# Patient Record
Sex: Male | Born: 1935 | Race: White | Hispanic: No | Marital: Married | Smoking: Former smoker
Health system: Southern US, Community
[De-identification: ages and names within clinical notes are randomized; demographics above are authoritative.]

## PROBLEM LIST (undated history)

## (undated) DIAGNOSIS — I739 Peripheral vascular disease, unspecified: Secondary | ICD-10-CM

## (undated) DIAGNOSIS — I2510 Atherosclerotic heart disease of native coronary artery without angina pectoris: Secondary | ICD-10-CM

## (undated) DIAGNOSIS — E7800 Pure hypercholesterolemia, unspecified: Secondary | ICD-10-CM

## (undated) DIAGNOSIS — I714 Abdominal aortic aneurysm, without rupture, unspecified: Secondary | ICD-10-CM

## (undated) DIAGNOSIS — I219 Acute myocardial infarction, unspecified: Secondary | ICD-10-CM

## (undated) HISTORY — DX: Atherosclerotic heart disease of native coronary artery without angina pectoris: I25.10

## (undated) HISTORY — PX: SPINE SURGERY: SHX786

## (undated) HISTORY — DX: Acute myocardial infarction, unspecified: I21.9

## (undated) HISTORY — PX: APPENDECTOMY: SHX54

---

## 1989-06-07 HISTORY — PX: BACK SURGERY: SHX140

## 2010-10-07 DIAGNOSIS — I2510 Atherosclerotic heart disease of native coronary artery without angina pectoris: Secondary | ICD-10-CM

## 2010-10-07 HISTORY — DX: Atherosclerotic heart disease of native coronary artery without angina pectoris: I25.10

## 2011-08-08 HISTORY — PX: FRACTURE SURGERY: SHX138

## 2011-09-16 ENCOUNTER — Encounter (HOSPITAL_BASED_OUTPATIENT_CLINIC_OR_DEPARTMENT_OTHER): Payer: Medicare Other | Attending: Internal Medicine

## 2011-09-16 DIAGNOSIS — E119 Type 2 diabetes mellitus without complications: Secondary | ICD-10-CM | POA: Insufficient documentation

## 2011-09-16 DIAGNOSIS — L89509 Pressure ulcer of unspecified ankle, unspecified stage: Secondary | ICD-10-CM | POA: Insufficient documentation

## 2011-09-16 DIAGNOSIS — L8990 Pressure ulcer of unspecified site, unspecified stage: Secondary | ICD-10-CM | POA: Insufficient documentation

## 2011-09-16 DIAGNOSIS — Z79899 Other long term (current) drug therapy: Secondary | ICD-10-CM | POA: Insufficient documentation

## 2011-09-16 DIAGNOSIS — E785 Hyperlipidemia, unspecified: Secondary | ICD-10-CM | POA: Insufficient documentation

## 2011-09-16 DIAGNOSIS — Z7982 Long term (current) use of aspirin: Secondary | ICD-10-CM | POA: Insufficient documentation

## 2011-09-16 DIAGNOSIS — Z794 Long term (current) use of insulin: Secondary | ICD-10-CM | POA: Insufficient documentation

## 2011-09-16 DIAGNOSIS — L89899 Pressure ulcer of other site, unspecified stage: Secondary | ICD-10-CM | POA: Insufficient documentation

## 2011-09-16 NOTE — Progress Notes (Unsigned)
[facility]  NAME:  [name], [name]                ACCOUNT NO.:  [id number]  MEDICAL RECORD NO.:  [id number]      DATE OF BIRTH:  1935/10/02  PHYSICIAN:  Jonelle Sports. Saxon Crosby, M.D.  VISIT DATE:  09/16/2011                                  OFFICE VISIT   HISTORY OF PRESENT ILLNESS:  This 76 year old gentleman is seen referral of Dr. Chryl Heck for assistance with management of some apparent pressure wounds in association with recent right foot and ankle trauma and surgery.  The patient has a background history of type 2 diabetes, which apparently has never been an extremely tight control which has not been a source of particular problems to him and most specifically, he has had no foot ulcers on that basis.  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  With that background history apparently on August 22, 2011, the patient sustained a fall in his bedroom where he apparently everted his left foot and sustained a fracture at the right ankle.  Four days later, he underwent open reduction and internal fixation after __________ was made for swelling to go down etc.  He apparently has done well from a surgical perspective with x-rays continued to show good alignment, et [name].  However, he has been in a walking boot for that extremity to allow him to get back and forth to the bathroom, and apparently has sustained some pressure injuries secondary to that.  It is for those that he is referred here today for our evaluation and advice.  PAST MEDICAL HISTORY:  Surgeries include previous lumbar disk surgery, the open reduction and internal fixation of the fracture of the left malleolus on August 26, 2011.  OTHER HOSPITALIZATIONS:  None.  ALLERGIES:  He has no known medicinal allergies.  REGULAR MEDICATIONS: 1. Cephalexin 500 mg q.i.d. 2. OxyContin 10 mg twice daily. 3. Metformin  1000 mg twice daily. 4. NovoLog insulin 70/30, 25 units before each meal that he takes. 5. Lantus 30 units at bedtime. 6. He is also on simvastatin 40 mg daily. 7. Lovaza 1 g twice daily. 8. Baby aspirin 81 mg daily.  PERSONAL HISTORY:  He is married and lives with his wife.  He presently can do very little without assistance because of this injury.  He denies smoking, use of alcohol or street drugs.  He is retired and does not work outside the home at this point.  FAMILY HISTORY:  Not reviewed in detail but is not particularly pertinent to his given situation though he has diabetes in the family.  REVIEW OF SYSTEMS:  The patient denies any previous stroke or cerebrovascular symptomatology, has no history of heart disease or hypertension.  He does have hyperlipidemia which is being treated, and does have type 2 diabetes, which has been treated with limited success. He does not know his hemoglobin A1c level but says that blood sugars ordinarily run between 160-220 mg/dL and that they have been somewhat higher since this illness.  He has no history of cancer.  No radiation or chemotherapy.  Has never required dialysis nor is he aware of any renal insufficiency.  He has no history of blood dyscrasias, unexplained anemia or obvious  blood loss.  PHYSICAL EXAMINATION:  VITAL SIGNS:  Blood pressure is 124/72, pulse is 110 and regular, respirations 18, temperature 98.5, blood glucose 160 mg/dL. GENERAL:  He is alert, cooperative, elderly gentleman was appearing his stated age of 31, in some discomfort but no striking distress. SKIN:  His skin texture and turgor are good.  His color is satisfactory. NECKS:  His neck veins are flat. CHEST:  Grossly clear to auscultation. HEART:  Rapid regular rhythm with no definite murmur or gallop. ABDOMEN:  Slightly protuberant but nontender without obvious organomegaly or masses. EXTREMITIES:  All pulses palpable.  No significant edema.  The right  lower extremity is removed from his walking cast, and he seems to have approximately 7 cm of surgical incision over the lateral malleolus where there is some slight dehiscence and evidence of some degree of tissue injury and maceration surrounding the wound which appears related frankly to pressure.  On the medial aspect of that same ankle and hindfoot is an area of first to second-degree skin injury with some redness and weeping as well. Over the medial aspect of the ankle on several areas on the distal anterior shin over the heel laterally and at the fifth metatarsal head area, both on the plantar aspect and laterally, there are evidence for pressure wounds with tissue necrosis, which is dry and intact at this point.  IMPRESSION:  Multiple pressure wounds of the right lower extremity likely secondary to the walking boot he has been wearing.  He also has evidence of a tendency to externally rotate that leg which would explain why even in bed he may well have had some additional specific pressure to the lateral aspect of the heel and the fifth metatarsal head areas as well as that of the wound.  Diabetes mellitus type 2, currently in suboptimal control.  DISPOSITION:  With respect to the diabetes, we have encouraged him to tightness control if possible.  I have suggested he increase his Lantus to 40 units each night from its current dose of 30, and that he consult with his primary physician regarding possible other adjustments and probably ideally some more frequent surveillance.  With respect to the wounds, there is nothing I see now that would be benefited by debridement and indeed most of these areas would be ill served to remove the early developing eschars in that they are now adequately covered to prevent invasion of infection.  The wound itself looks as if it will not primarily heal.  Stitches are still in place, and again I do not think any effective debridement can be  done at that site right now.  We have elected to cover the wound itself with Silvercel, which contains silvers and anti-infective agent and alginate to absorb the drainage there.  We have covered the other areas simply with foam dressing of various sorts.  We placed that foot in a bulky protective wrap and returned in to his walking cast closing it very loosely.  We recommended to the patient that he temporarily go on bedrest and that he supports this  right lower extremity at the calf with pillows and/or a foam padding, so that pressure from all areas related to the wound is avoided.  We have suggested that he use an urinal and a bedside commode, so that he can simply put it out of bed on his good leg and not have to do any weightbearing and not have to replace the cast.  We recommended that he continue on  his current antibiotic, and his wound was not re-cultured today.  We do feel that although his vascular status appears adequate and although his ABI here is 1.0 that in the face of diabetes sometimes these can be misleading and we would prefer that he have arterial Dopplers done.  This apparently can be done in [place]. He is given a prescription to present to the hospital, vascular lab to have those studies done.  I have spoken with one of the nurses in the referring doctor's office in the absence of the surgeon and her PA both today and have told them of our plans.  I do believe that there might ought to be some consideration of blood thinner other than his baby aspirin, and I will leave this to the managing physicians in [place].  Plan will be for the patient to return here in 1 week, and we will re- evaluate these wounds.  It is noted that in the interim he is to see the surgeon for consideration of removal of the sutures which I suspect might just as well be done from our point of view as well as from theirs.  Again, his followup visit here will be in 1 week or sooner  if need be.          ______________________________ Jonelle Sports. Cheryll Cockayne, M.D.     RES/MEDQ  D:  09/16/2011  T:  09/16/2011  Job:  [id number]  cc:   Carie Caddy

## 2011-09-23 ENCOUNTER — Inpatient Hospital Stay (HOSPITAL_COMMUNITY): Payer: Medicare Other

## 2011-09-23 ENCOUNTER — Encounter: Payer: Self-pay | Admitting: Emergency Medicine

## 2011-09-23 ENCOUNTER — Emergency Department (HOSPITAL_COMMUNITY)
Admission: EM | Admit: 2011-09-23 | Discharge: 2011-09-23 | Disposition: A | Discharge status: Home / Self Care | Payer: Medicare Other | Source: Home / Self Care | Attending: Emergency Medicine | Admitting: Emergency Medicine

## 2011-09-23 ENCOUNTER — Emergency Department (HOSPITAL_COMMUNITY): Payer: Medicare Other

## 2011-09-23 ENCOUNTER — Encounter: Payer: Self-pay | Admitting: Surgery

## 2011-09-23 ENCOUNTER — Inpatient Hospital Stay (HOSPITAL_COMMUNITY)
Admission: AD | Admit: 2011-09-23 | Discharge: 2011-10-03 | DRG: 253 | Disposition: A | Discharge status: Home Health | Payer: Medicare Other | Source: Ambulatory Visit | Attending: Surgery | Admitting: Surgery

## 2011-09-23 ENCOUNTER — Ambulatory Visit (INDEPENDENT_AMBULATORY_CARE_PROVIDER_SITE_OTHER): Payer: Medicare Other | Admitting: Surgery

## 2011-09-23 VITALS — BP 142/80 | HR 105 | Temp 97.6°F | Resp 18 | Ht 70.0 in | Wt 214.0 lb

## 2011-09-23 DIAGNOSIS — I70269 Atherosclerosis of native arteries of extremities with gangrene, unspecified extremity: Principal | ICD-10-CM | POA: Diagnosis present

## 2011-09-23 DIAGNOSIS — L97309 Non-pressure chronic ulcer of unspecified ankle with unspecified severity: Secondary | ICD-10-CM | POA: Diagnosis present

## 2011-09-23 DIAGNOSIS — Z7982 Long term (current) use of aspirin: Secondary | ICD-10-CM

## 2011-09-23 DIAGNOSIS — E1169 Type 2 diabetes mellitus with other specified complication: Secondary | ICD-10-CM | POA: Diagnosis present

## 2011-09-23 DIAGNOSIS — L98499 Non-pressure chronic ulcer of skin of other sites with unspecified severity: Secondary | ICD-10-CM

## 2011-09-23 DIAGNOSIS — Y831 Surgical operation with implant of artificial internal device as the cause of abnormal reaction of the patient, or of later complication, without mention of misadventure at the time of the procedure: Secondary | ICD-10-CM | POA: Diagnosis present

## 2011-09-23 DIAGNOSIS — Z01818 Encounter for other preprocedural examination: Secondary | ICD-10-CM

## 2011-09-23 DIAGNOSIS — I498 Other specified cardiac arrhythmias: Secondary | ICD-10-CM | POA: Diagnosis not present

## 2011-09-23 DIAGNOSIS — E785 Hyperlipidemia, unspecified: Secondary | ICD-10-CM | POA: Diagnosis present

## 2011-09-23 DIAGNOSIS — Z79899 Other long term (current) drug therapy: Secondary | ICD-10-CM

## 2011-09-23 DIAGNOSIS — E1159 Type 2 diabetes mellitus with other circulatory complications: Secondary | ICD-10-CM

## 2011-09-23 DIAGNOSIS — T84498A Other mechanical complication of other internal orthopedic devices, implants and grafts, initial encounter: Secondary | ICD-10-CM | POA: Diagnosis present

## 2011-09-23 DIAGNOSIS — M9080 Osteopathy in diseases classified elsewhere, unspecified site: Secondary | ICD-10-CM | POA: Diagnosis present

## 2011-09-23 DIAGNOSIS — I7092 Chronic total occlusion of artery of the extremities: Secondary | ICD-10-CM

## 2011-09-23 DIAGNOSIS — F411 Generalized anxiety disorder: Secondary | ICD-10-CM | POA: Diagnosis not present

## 2011-09-23 DIAGNOSIS — M869 Osteomyelitis, unspecified: Secondary | ICD-10-CM | POA: Diagnosis present

## 2011-09-23 DIAGNOSIS — R339 Retention of urine, unspecified: Secondary | ICD-10-CM | POA: Diagnosis not present

## 2011-09-23 DIAGNOSIS — I739 Peripheral vascular disease, unspecified: Secondary | ICD-10-CM

## 2011-09-23 DIAGNOSIS — S91309A Unspecified open wound, unspecified foot, initial encounter: Secondary | ICD-10-CM

## 2011-09-23 DIAGNOSIS — Z794 Long term (current) use of insulin: Secondary | ICD-10-CM

## 2011-09-23 DIAGNOSIS — I10 Essential (primary) hypertension: Secondary | ICD-10-CM

## 2011-09-23 DIAGNOSIS — L97509 Non-pressure chronic ulcer of other part of unspecified foot with unspecified severity: Secondary | ICD-10-CM | POA: Diagnosis present

## 2011-09-23 HISTORY — DX: Pure hypercholesterolemia, unspecified: E78.00

## 2011-09-23 LAB — BASIC METABOLIC PANEL
GFR calc Af Amer: 70 mL/min — ABNORMAL LOW (ref >90)
GFR calc non Af Amer: 60 mL/min — ABNORMAL LOW (ref >90)
Potassium: 4.2 mEq/L (ref 3.5–5.1)
Sodium: 134 mEq/L — ABNORMAL LOW (ref 135–145)

## 2011-09-23 LAB — CBC
Hemoglobin: 11.8 g/dL — ABNORMAL LOW (ref 13.0–17.0)
RBC: 4.11 MIL/uL — ABNORMAL LOW (ref 4.22–5.81)

## 2011-09-23 MED ORDER — METOPROLOL TARTRATE 1 MG/ML IV SOLN: 2.0000 mg | INTRAVENOUS | Status: DC | PRN

## 2011-09-23 MED ORDER — OXYCODONE HCL 5 MG PO TABS
5.0000 mg | ORAL_TABLET | ORAL | Status: DC | PRN
  Administered 2011-09-23 – 2011-09-24 (×2): 10 mg via ORAL
  Administered 2011-09-25 (×2): 5 mg via ORAL
  Administered 2011-09-26: 10 mg via ORAL
  Filled 2011-09-23 (×2): qty 2
  Filled 2011-09-23: qty 1
  Filled 2011-09-23: qty 2
  Filled 2011-09-23: qty 1

## 2011-09-23 MED ORDER — ALUM & MAG HYDROXIDE-SIMETH 200-200-20 MG/5ML PO SUSP: 15.0000 mL | ORAL | Status: DC | PRN

## 2011-09-23 MED ORDER — LABETALOL HCL 5 MG/ML IV SOLN
10.0000 mg | INTRAVENOUS | Status: DC | PRN
  Filled 2011-09-23: qty 4

## 2011-09-23 MED ORDER — SODIUM CHLORIDE 0.9 % IV SOLN
INTRAVENOUS | Status: DC
  Administered 2011-09-23: 50 mL/h via INTRAVENOUS

## 2011-09-23 MED ORDER — VANCOMYCIN HCL IN DEXTROSE 1-5 GM/200ML-% IV SOLN
1000.0000 mg | Freq: Two times a day (BID) | INTRAVENOUS | Status: DC
  Administered 2011-09-24 – 2011-09-26 (×6): 1000 mg via INTRAVENOUS
  Filled 2011-09-23 (×8): qty 200

## 2011-09-23 MED ORDER — PHENOL 1.4 % MT LIQD
1.0000 | OROMUCOSAL | Status: DC | PRN
  Filled 2011-09-23: qty 177

## 2011-09-23 MED ORDER — OXYCODONE-ACETAMINOPHEN 5-325 MG PO TABS
2.0000 | ORAL_TABLET | Freq: Once | ORAL | Status: AC
  Administered 2011-09-23: 2 via ORAL
  Filled 2011-09-23: qty 2

## 2011-09-23 MED ORDER — MORPHINE SULFATE 2 MG/ML IJ SOLN
2.0000 mg | INTRAMUSCULAR | Status: DC | PRN
  Administered 2011-09-23: 2 mg via INTRAVENOUS
  Administered 2011-09-23 – 2011-09-25 (×9): 4 mg via INTRAVENOUS
  Administered 2011-09-27: 2 mg via INTRAVENOUS
  Filled 2011-09-23 (×3): qty 2
  Filled 2011-09-23: qty 1
  Filled 2011-09-23 (×4): qty 2
  Filled 2011-09-23: qty 1
  Filled 2011-09-23 (×2): qty 2

## 2011-09-23 MED ORDER — HYDRALAZINE HCL 20 MG/ML IJ SOLN
10.0000 mg | INTRAMUSCULAR | Status: DC | PRN
  Filled 2011-09-23: qty 0.5

## 2011-09-23 MED ORDER — FAMOTIDINE IN NACL 20-0.9 MG/50ML-% IV SOLN
20.0000 mg | Freq: Two times a day (BID) | INTRAVENOUS | Status: DC
  Administered 2011-09-23 – 2011-09-25 (×4): 20 mg via INTRAVENOUS
  Filled 2011-09-23 (×5): qty 50

## 2011-09-23 MED ORDER — POTASSIUM CHLORIDE CRYS ER 20 MEQ PO TBCR
20.0000 meq | EXTENDED_RELEASE_TABLET | Freq: Once | ORAL | Status: AC
  Administered 2011-09-23: 20 meq via ORAL

## 2011-09-23 MED ORDER — VANCOMYCIN HCL 1000 MG IV SOLR
1500.0000 mg | Freq: Once | INTRAVENOUS | Status: AC
  Administered 2011-09-23: 1500 mg via INTRAVENOUS
  Filled 2011-09-23 (×2): qty 1500

## 2011-09-23 MED ORDER — ONDANSETRON HCL 4 MG/2ML IJ SOLN
4.0000 mg | Freq: Four times a day (QID) | INTRAMUSCULAR | Status: DC | PRN
  Filled 2011-09-23: qty 2

## 2011-09-23 MED ORDER — MORPHINE SULFATE 2 MG/ML IJ SOLN
INTRAMUSCULAR | Status: AC
  Administered 2011-09-23: 4 mg via INTRAVENOUS
  Filled 2011-09-23: qty 2

## 2011-09-23 MED ORDER — MORPHINE SULFATE 4 MG/ML IJ SOLN: 6.0000 mg | Freq: Once | INTRAMUSCULAR | Status: DC

## 2011-09-23 MED ORDER — GUAIFENESIN-DM 100-10 MG/5ML PO SYRP: 15.0000 mL | ORAL_SOLUTION | ORAL | Status: DC | PRN

## 2011-09-23 MED ORDER — ENOXAPARIN SODIUM 40 MG/0.4ML ~~LOC~~ SOLN
40.0000 mg | SUBCUTANEOUS | Status: DC
  Administered 2011-09-23 – 2011-09-24 (×2): 40 mg via SUBCUTANEOUS
  Filled 2011-09-23 (×2): qty 0.4

## 2011-09-23 MED ORDER — PIPERACILLIN-TAZOBACTAM 3.375 G IVPB
3.3750 g | Freq: Three times a day (TID) | INTRAVENOUS | Status: DC
  Administered 2011-09-23 – 2011-09-27 (×11): 3.375 g via INTRAVENOUS
  Filled 2011-09-23 (×16): qty 50

## 2011-09-23 NOTE — ED Notes (Signed)
Pt has had surgery on foot and the stiches have been in for 6 weeks, pt states foot is now infected pmd doesn't want to take them out due to infection. Has apt with wound center at 1500 today, pt is having pain.

## 2011-09-23 NOTE — ED Notes (Signed)
States that he has an appt at the wound care center in [place] at 1500 but was unable to tolerate the pain.

## 2011-09-23 NOTE — Progress Notes (Signed)
[facility]   Patient name: [name] MRN: [id number] DOB: [birth year] Sex: male   Referred by: Dr. Cheryll Cockayne  Reason for referral:  Chief Complaint  Patient presents with  . New Evaluation    Open wound over Right malleolus   REF-->> Dr. Cheryll Cockayne.   Pt had ORIF on Right ankle on 08-26-2011  at [facility].    HISTORY OF PRESENT ILLNESS: The patient comes in today for a wound evaluation of his right leg. Approximately one month ago he broke his right ankle during a fall he underwent open reduction and internal fixation in [place]. He now has an eschar on the medial and some of his foot as well as exposed hardware. 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 He is having significant pain in his leg. She's been told by the physicians and aspirin that he needs a below knee amputation the wound center felt that this could be salvaged. For that reason the patient was sent to me for further evaluation the patient is highly functional at home prior to this fracture.  The patient is a diabetic his blood sugars range between 1:30 and 150. He told me that his hemoglobin A1c was very high at his most recent visit and his insulin was increased. He also takes a statin for his hypercholesterolemia. He is a nonsmoker.  Past Medical History  Diagnosis Date  . Diabetes mellitus   . Hypercholesteremia     Past Surgical History  Procedure Date  . Spine surgery   . Fracture surgery 08-2011    ORIF right ankle    History   Social History  . Marital Status: Married    Spouse Name: N/A    Number of Children: N/A  . Years of Education: N/A   Occupational History  . Not on file.   Social History Main Topics  . Smoking status: Never Smoker   . Smokeless tobacco: Not on file  . Alcohol Use: No  . Drug Use: No  . Sexually Active:    Other Topics Concern  . Not on file    Social History Narrative  . No narrative on file    Family History  Problem Relation Age of Onset  . Diabetes Other     Allergies as of 09/23/2011  . (No Known Allergies)    Current Outpatient Prescriptions on File Prior to Visit  Medication Sig Dispense Refill  . aspirin EC 81 MG tablet Take 81 mg by mouth every evening.        . cephALEXin (KEFLEX) 500 MG capsule Take 500 mg by mouth 4 (four) times daily.        [name] ibuprofen (ADVIL,MOTRIN) 200 MG tablet Take 400 mg by mouth every 8 (eight) hours as needed. For pain.       [name] insulin aspart protamine-insulin aspart (NOVOLOG 70/30) (70-30) 100 UNIT/ML injection Inject 25 Units into the skin 3 (three) times daily with meals.        . insulin glargine (LANTUS) 100 UNIT/ML injection Inject 30 Units into the skin at bedtime.        . metFORMIN (GLUCOPHAGE) 1000 MG tablet Take 1,000 mg by mouth 2 (two) times daily.        [name] omega-3 acid ethyl esters (LOVAZA) 1 G capsule Take 1 g by mouth 2 (two) times daily.        [name] oxyCODONE (OXYCONTIN)  10 MG 12 hr tablet Take 10 mg by mouth every 12 (twelve) hours.        . simvastatin (ZOCOR) 40 MG tablet Take 40 mg by mouth at bedtime.         Current Facility-Administered Medications on File Prior to Visit  Medication Dose Route Frequency Provider Last Rate Last Dose  . morphine 2 MG/ML injection        4 mg at 09/23/11 0820  . oxyCODONE-acetaminophen (PERCOCET) 5-325 MG per tablet 2 tablet  2 tablet Oral Once Nat Christen, MD   2 tablet at 09/23/11 0944  . DISCONTD: morphine 4 MG/ML injection 6 mg  6 mg Intravenous Once Nat Christen, MD         REVIEW OF SYSTEMS: Cardiovascular: No chest pain, chest pressure, palpitations, orthopnea, or dyspnea on exertion. No claudication or rest pain,  No history of DVT or phlebitis. Pulmonary: No productive cough, asthma or wheezing. Neurologic: No weakness, paresthesias, aphasia, or amaurosis. No dizziness. Hematologic: No bleeding problems or  clotting disorders. Musculoskeletal: No joint pain or joint swelling. Gastrointestinal: No blood in stool or hematemesis Genitourinary: No dysuria or hematuria. Psychiatric:: No history of major depression. Integumentary: Eschar an open wound on right leg Constitutional: No fever or chills.  PHYSICAL EXAMINATION: General: The patient appears their stated age.  Vital signs are BP 142/80  Pulse 105  Temp(Src) 97.6 F (36.4 C) (Oral)  Resp 18  Ht 5\' 10"  (1.778 m)  Wt 214 lb (97.07 kg)  BMI 30.71 kg/m2  SpO2 93% HEENT:  No gross abnormalities Pulmonary: Respirations are non-labored Abdomen: Soft and non-tender  Musculoskeletal: Exposed hardware right ankle Neurologic: No focal weakness or paresthesias are detected, decreased sensation and motor function of the right foot Skin: Open wound right foot with exposed hardware Psychiatric: The patient has normal affect. Cardiovascular: There is a regular rate and rhythm without significant murmur appreciated. Palpable femoral pulses  Diagnostic Studies: Outside ultrasound shows occluded right superficial femoral artery ankle-brachial indices could not be obtained   Medication Changes: None  Assessment:  Right ankle fracture status post repair with exposed hardware Plan: I will plan on admitting the patient to the hospital for IV antibiotics. I'll consult orthopedics to get their recommendations with regards to removing of the hardware. I would also like them to comment if they feel that this leg is salvageable. In my opinion I feel like we should proceed with revascularization in an attempt at limb salvage. I have scheduled him for arteriogram this Wednesday. I'll obtain x-rays upon his admission of the right ankle.     Jorge Ny, M.D. [facility] [place] Office: [phone] Pager:  [phone]

## 2011-09-23 NOTE — ED Provider Notes (Signed)
History     CSN: [id number] Arrival date & time: 09/23/2011  7:34 AM   First MD Initiated Contact with Patient 09/23/11 0750      Chief Complaint  Patient presents with  . Foot Pain    (Consider location/radiation/quality/duration/timing/severity/associated sxs/prior treatment) HPI Comments: Patient here after having broken his ankle several weeks ago when having an open reduction and internal fixation procedure performed in [place].  He has had problems with wound healing and his surgeon in [place] has referred him to the wound care center here at [facility].  He has first appointment last week.  He is currently on Keflex and OxyContin for pain control.  Patient presents complaining of increased pain.  He is post have a repeat wound care appointment this afternoon at 3 PM as well.  He notes some subjective fevers but is afebrile at this time.  He does not know what surgeon he is supposed to continue to followup with here since he states his previous doctor is not comfortable caring for him with his wound healing issues at this time.  Patient is a 76 y.o. male presenting with lower extremity pain. The history is provided by the patient and the spouse. No language interpreter was used.  Foot Pain This is a chronic problem. The current episode started more than 1 week ago. The problem occurs constantly. The problem has been gradually worsening. Pertinent negatives include no chest pain, no abdominal pain, no headaches and no shortness of breath.    Past Medical History  Diagnosis Date  . Diabetes mellitus   . Hypercholesteremia     Past Surgical History  Procedure Date  . Back surgery   . Ankle fracture surgery     History reviewed. No pertinent family history.  History  Substance Use Topics  . Smoking status: Never Smoker   . Smokeless tobacco: Not on file  . Alcohol Use:       Review of Systems  Constitutional: Negative.  Negative for fever and chills.  HENT: Negative.    Eyes: Negative.  Negative for discharge and redness.  Respiratory: Negative.  Negative for cough and shortness of breath.   Cardiovascular: Negative.  Negative for chest pain.  Gastrointestinal: Negative.  Negative for nausea, vomiting and abdominal pain.  Genitourinary: Negative.  Negative for hematuria.  Musculoskeletal: Positive for joint swelling and arthralgias. Negative for back pain.  Skin: Positive for color change and wound. Negative for rash.  Neurological: Negative for syncope and headaches.  Hematological: Negative.  Negative for adenopathy.  Psychiatric/Behavioral: Negative.  Negative for confusion.  All other systems reviewed and are negative.    Allergies  Review of patient's allergies indicates no known allergies.  Home Medications  No current outpatient prescriptions on file.  BP 126/68  Pulse 106  Temp(Src) 98.7 F (37.1 C) (Oral)  Resp 16  SpO2 95%  Physical Exam  Constitutional: He is oriented to person, place, and time. He appears well-developed and well-nourished.  Non-toxic appearance. He does not have a sickly appearance.  HENT:  Head: Normocephalic and atraumatic.  Right Ear: External ear normal.  Eyes: Conjunctivae, EOM and lids are normal. Pupils are equal, round, and reactive to light.  Neck: Trachea normal, normal range of motion and full passive range of motion without pain. Neck supple.  Cardiovascular: Normal rate, regular rhythm and normal heart sounds.   Pulmonary/Chest: Effort normal and breath sounds normal. No respiratory distress.  Abdominal: Soft. Normal appearance. He exhibits no distension. There is no  tenderness. There is no rebound and no CVA tenderness.  Musculoskeletal: Normal range of motion. He exhibits edema.  Neurological: He is alert and oriented to person, place, and time. He has normal strength.  Skin: Skin is warm, dry and intact. No rash noted. There is erythema.       Patient has mild swelling to his right foot.  It is  difficult to palpate pulses due to the swelling.  Patient at the lateral aspect of his ankle has an open and you can see the hardware from his surgery.  There is no drainage from the site. At the medial and lateral aspects of his ankle and dorsum of his foot have black areas.  There is mild erythema around the dorsum of his foot to his ankle.  Only mild warmth is present. There is serosanguinous drainage but no purulent drainage.  Sensation is decreased on the medial aspect of the foot.    Psychiatric: He has a normal mood and affect. His behavior is normal. Judgment and thought content normal.    ED Course  Procedures (including critical care time)     MDM  Patient discussed with the wound care nurse the patient saw last week and is going to see again today.  They have been obtaining workup for arterial studies and also believe that some of his wounds are actually pressure sores due to being bedridden since his surgery.  Patient does not have acute fevers here and does not have signs of significant infection requiring admission at this time.  He appears to have mild cellulitis but likely the erythema seen is part of the wound healing process.  Patient will have his pain treated here and the nurses stated that she will be able to work him in for a morning appointment to be seen at the wound care clinic where they can determine his further course of care.  As they do not feel this patient warrants inpatient admission for either pain control or IV antibiotics at this time once pain control is improved I feel the patient be safely discharged to go directly to the wound care clinic.        Nat Christen, MD 09/23/11 [phone]

## 2011-09-23 NOTE — Progress Notes (Addendum)
ANTIBIOTIC CONSULT NOTE - INITIAL  Pharmacy Consult for zosyn Indication: RLE wound  No Known Allergies  Patient Measurements: Height: 5\' 10"  (177.8 cm) Weight: 198 lb 13.7 oz (90.2 kg) IBW/kg (Calculated) : 73    Vital Signs: Temp: 97.7 F (36.5 C) (12/17 1525) Temp src: Oral (12/17 1525) BP: 151/81 mmHg (12/17 1525) Pulse Rate: 105  (12/17 1525) Intake/Output from previous day:   Intake/Output from this shift:    Labs:   Microbiology: No results found for this or any previous visit (from the past 720 hour(s)).  Medical History: Past Medical History  Diagnosis Date  . Diabetes mellitus   . Hypercholesteremia     Medications:  Prescriptions prior to admission  Medication Sig Dispense Refill  . aspirin EC 81 MG tablet Take 81 mg by mouth every evening.        [name] ibuprofen (ADVIL,MOTRIN) 200 MG tablet Take 400 mg by mouth every 8 (eight) hours as needed. For pain.       [name] insulin aspart protamine-insulin aspart (NOVOLOG 70/30) (70-30) 100 UNIT/ML injection Inject 25 Units into the skin 3 (three) times daily with meals.        . insulin glargine (LANTUS) 100 UNIT/ML injection Inject 30 Units into the skin at bedtime.        . metFORMIN (GLUCOPHAGE) 1000 MG tablet Take 1,000 mg by mouth 2 (two) times daily.        [name] omega-3 acid ethyl esters (LOVAZA) 1 G capsule Take 1 g by mouth 2 (two) times daily.        [name] oxyCODONE (OXYCONTIN) 10 MG 12 hr tablet Take 10 mg by mouth every 12 (twelve) hours.        . simvastatin (ZOCOR) 40 MG tablet Take 40 mg by mouth at bedtime.        . cephALEXin (KEFLEX) 500 MG capsule Take 500 mg by mouth 4 (four) times daily.         Assessment: 64 YOM s/p ORIF of R ankle approx 1 month ago following fall/fracture.  Ultrasound revealed occluded femoral artery.  Hardware is exposed, he was referred to [facility] for possible amputation, but also being evaluated for revascularization.  No labs are yet available   Plan:  1. Zosyn dose is 3.375gm IV  q8h with 4hr infusion.  OK for renal function 2. Also orders to start vancomcyin 1500mg  IV x1, then 1gm IV q12h, check steady state trough if vancomycin continues.    Dannielle Huh 09/23/2011,4:01 PM

## 2011-09-24 ENCOUNTER — Other Ambulatory Visit: Payer: Self-pay

## 2011-09-24 ENCOUNTER — Ambulatory Visit (HOSPITAL_COMMUNITY): Admit: 2011-09-24 | Payer: Self-pay | Admitting: Surgery

## 2011-09-24 ENCOUNTER — Encounter (HOSPITAL_COMMUNITY)
Admission: AD | Disposition: A | Discharge status: Home Health | Payer: Self-pay | Source: Ambulatory Visit | Attending: Surgery

## 2011-09-24 DIAGNOSIS — I495 Sick sinus syndrome: Secondary | ICD-10-CM

## 2011-09-24 DIAGNOSIS — Z01810 Encounter for preprocedural cardiovascular examination: Secondary | ICD-10-CM

## 2011-09-24 DIAGNOSIS — I739 Peripheral vascular disease, unspecified: Secondary | ICD-10-CM

## 2011-09-24 HISTORY — PX: LOWER EXTREMITY ANGIOGRAM: SHX5508

## 2011-09-24 LAB — CBC
HCT: 35.4 % — ABNORMAL LOW (ref 39.0–52.0)
Hemoglobin: 11.6 g/dL — ABNORMAL LOW (ref 13.0–17.0)
MCH: 28.4 pg (ref 26.0–34.0)
MCHC: 32.8 g/dL (ref 30.0–36.0)

## 2011-09-24 LAB — GLUCOSE, CAPILLARY
Glucose-Capillary: 189 mg/dL — ABNORMAL HIGH (ref 70–99)
Glucose-Capillary: 187 mg/dL — ABNORMAL HIGH (ref 70–99)

## 2011-09-24 SURGERY — ANGIOGRAM, LOWER EXTREMITY
Anesthesia: LOCAL

## 2011-09-24 MED ORDER — ENOXAPARIN SODIUM 40 MG/0.4ML ~~LOC~~ SOLN
40.0000 mg | SUBCUTANEOUS | Status: DC
  Administered 2011-09-24 – 2011-09-25 (×2): 40 mg via SUBCUTANEOUS
  Filled 2011-09-24 (×4): qty 0.4

## 2011-09-24 MED ORDER — MORPHINE SULFATE 4 MG/ML IJ SOLN: 4.0000 mg | Freq: Once | INTRAMUSCULAR | Status: AC

## 2011-09-24 MED ORDER — MIDAZOLAM HCL 2 MG/2ML IJ SOLN
INTRAMUSCULAR | Status: AC
  Filled 2011-09-24: qty 2

## 2011-09-24 MED ORDER — INSULIN GLARGINE 100 UNIT/ML ~~LOC~~ SOLN
30.0000 [IU] | Freq: Every day | SUBCUTANEOUS | Status: DC
  Administered 2011-09-24 – 2011-09-26 (×3): 30 [IU] via SUBCUTANEOUS
  Filled 2011-09-24 (×2): qty 3

## 2011-09-24 MED ORDER — INSULIN ASPART PROT & ASPART (70-30 MIX) 100 UNIT/ML ~~LOC~~ SUSP
25.0000 [IU] | Freq: Three times a day (TID) | SUBCUTANEOUS | Status: DC
  Administered 2011-09-24 – 2011-09-26 (×6): 25 [IU] via SUBCUTANEOUS
  Filled 2011-09-24: qty 3

## 2011-09-24 MED ORDER — HYDRALAZINE HCL 20 MG/ML IJ SOLN
INTRAMUSCULAR | Status: AC
  Filled 2011-09-24: qty 1

## 2011-09-24 MED ORDER — ALPRAZOLAM 0.25 MG PO TABS
0.2500 mg | ORAL_TABLET | Freq: Three times a day (TID) | ORAL | Status: DC | PRN
  Administered 2011-09-24 – 2011-09-26 (×3): 0.25 mg via ORAL
  Filled 2011-09-24 (×3): qty 1

## 2011-09-24 MED ORDER — OXYCODONE HCL 10 MG PO TB12
10.0000 mg | ORAL_TABLET | Freq: Two times a day (BID) | ORAL | Status: DC
  Administered 2011-09-24: 10 mg via ORAL
  Filled 2011-09-24: qty 1

## 2011-09-24 MED ORDER — OXYCODONE-ACETAMINOPHEN 5-325 MG PO TABS
1.0000 | ORAL_TABLET | ORAL | Status: DC | PRN
  Administered 2011-09-26 (×5): 2 via ORAL
  Filled 2011-09-24 (×5): qty 2

## 2011-09-24 MED ORDER — OMEGA-3-ACID ETHYL ESTERS 1 G PO CAPS
1.0000 g | ORAL_CAPSULE | Freq: Two times a day (BID) | ORAL | Status: DC
  Administered 2011-09-24 – 2011-09-27 (×7): 1 g via ORAL
  Filled 2011-09-24 (×8): qty 1

## 2011-09-24 MED ORDER — ACETAMINOPHEN 325 MG PO TABS: 650.0000 mg | ORAL_TABLET | ORAL | Status: DC | PRN

## 2011-09-24 MED ORDER — SIMVASTATIN 40 MG PO TABS: 40.0000 mg | ORAL_TABLET | Freq: Every day | ORAL | Status: DC

## 2011-09-24 MED ORDER — BISACODYL 10 MG RE SUPP: 10.0000 mg | Freq: Every day | RECTAL | Status: DC | PRN

## 2011-09-24 MED ORDER — INSULIN GLARGINE 100 UNIT/ML ~~LOC~~ SOLN: 30.0000 [IU] | Freq: Every day | SUBCUTANEOUS | Status: DC

## 2011-09-24 MED ORDER — INSULIN ASPART PROT & ASPART (70-30 MIX) 100 UNIT/ML ~~LOC~~ SUSP
25.0000 [IU] | Freq: Two times a day (BID) | SUBCUTANEOUS | Status: DC
  Filled 2011-09-24: qty 3

## 2011-09-24 MED ORDER — HEPARIN (PORCINE) IN NACL 2-0.9 UNIT/ML-% IJ SOLN
INTRAMUSCULAR | Status: AC
  Filled 2011-09-24: qty 1000

## 2011-09-24 MED ORDER — FENTANYL CITRATE 0.05 MG/ML IJ SOLN
INTRAMUSCULAR | Status: AC
  Filled 2011-09-24: qty 2

## 2011-09-24 MED ORDER — CEPHALEXIN 500 MG PO CAPS
500.0000 mg | ORAL_CAPSULE | Freq: Four times a day (QID) | ORAL | Status: DC
  Administered 2011-09-24 (×3): 500 mg via ORAL
  Filled 2011-09-24 (×7): qty 1

## 2011-09-24 MED ORDER — LIDOCAINE HCL (PF) 1 % IJ SOLN
INTRAMUSCULAR | Status: AC
  Filled 2011-09-24: qty 30

## 2011-09-24 MED ORDER — DOCUSATE SODIUM 100 MG PO CAPS
100.0000 mg | ORAL_CAPSULE | Freq: Two times a day (BID) | ORAL | Status: DC | PRN
  Administered 2011-09-25: 100 mg via ORAL
  Filled 2011-09-24: qty 1

## 2011-09-24 MED ORDER — ASPIRIN EC 81 MG PO TBEC
81.0000 mg | DELAYED_RELEASE_TABLET | Freq: Every evening | ORAL | Status: DC
  Administered 2011-09-24 – 2011-09-26 (×3): 81 mg via ORAL
  Filled 2011-09-24 (×4): qty 1

## 2011-09-24 MED ORDER — SODIUM CHLORIDE 0.9 % IV SOLN: 1.0000 mL/kg/h | INTRAVENOUS | Status: AC

## 2011-09-24 MED ORDER — ONDANSETRON HCL 4 MG/2ML IJ SOLN: 4.0000 mg | Freq: Four times a day (QID) | INTRAMUSCULAR | Status: DC | PRN

## 2011-09-24 MED ORDER — SIMVASTATIN 40 MG PO TABS
40.0000 mg | ORAL_TABLET | Freq: Every day | ORAL | Status: DC
  Administered 2011-09-24 – 2011-09-26 (×3): 40 mg via ORAL
  Filled 2011-09-24 (×4): qty 1

## 2011-09-24 NOTE — Interval H&P Note (Signed)
History and Physical Interval Note:  09/24/2011 11:35 AM  [name]  has presented today for surgery, with the diagnosis of r/o PAD  The various methods of treatment have been discussed with the patient and family. After consideration of risks, benefits and other options for treatment, the patient has consented to  Procedure(s): LOWER EXTREMITY ANGIOGRAM as a surgical intervention .  The patients' history has been reviewed, patient examined, no change in status, stable for surgery.  I have reviewed the patients' chart and labs.  Questions were answered to the patient's satisfaction.     Kiril Hippe IV, V. [facility]

## 2011-09-24 NOTE — H&P (View-Only) (Signed)
[facility] [place]   Patient name: [name] MRN: [id number] DOB: 09/13/1935 Sex: male   Referred by: Dr. Sevier  Reason for referral:  Chief Complaint  Patient presents with  . New Evaluation    Open wound over Right malleolus   REF-->> Dr. Sevier.   Pt had ORIF on Right ankle on 08-26-2011  at [facility].    HISTORY OF PRESENT ILLNESS: The patient comes in today for a wound evaluation of his right leg. Approximately one month ago he broke his right ankle during a fall he underwent open reduction and internal fixation in [place]. He now has an eschar on the medial and some of his foot as well as exposed hardware. 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 He is having significant pain in his leg. She's been told by the physicians and aspirin that he needs a below knee amputation the wound center felt that this could be salvaged. For that reason the patient was sent to me for further evaluation the patient is highly functional at home prior to this fracture.  The patient is a diabetic his blood sugars range between 1:30 and 150. He told me that his hemoglobin A1c was very high at his most recent visit and his insulin was increased. He also takes a statin for his hypercholesterolemia. He is a nonsmoker.  Past Medical History  Diagnosis Date  . Diabetes mellitus   . Hypercholesteremia     Past Surgical History  Procedure Date  . Spine surgery   . Fracture surgery 08-2011    ORIF right ankle    History   Social History  . Marital Status: Married    Spouse Name: N/A    Number of Children: N/A  . Years of Education: N/A   Occupational History  . Not on file.   Social History Main Topics  . Smoking status: Never Smoker   . Smokeless tobacco: Not on file  . Alcohol Use: No  . Drug Use: No  . Sexually Active:    Other Topics Concern  . Not on file    Social History Narrative  . No narrative on file    Family History  Problem Relation Age of Onset  . Diabetes Other     Allergies as of 09/23/2011  . (No Known Allergies)    Current Outpatient Prescriptions on File Prior to Visit  Medication Sig Dispense Refill  . aspirin EC 81 MG tablet Take 81 mg by mouth every evening.        . cephALEXin (KEFLEX) 500 MG capsule Take 500 mg by mouth 4 (four) times daily.        . ibuprofen (ADVIL,MOTRIN) 200 MG tablet Take 400 mg by mouth every 8 (eight) hours as needed. For pain.       . insulin aspart protamine-insulin aspart (NOVOLOG 70/30) (70-30) 100 UNIT/ML injection Inject 25 Units into the skin 3 (three) times daily with meals.        . insulin glargine (LANTUS) 100 UNIT/ML injection Inject 30 Units into the skin at bedtime.        . metFORMIN (GLUCOPHAGE) 1000 MG tablet Take 1,000 mg by mouth 2 (two) times daily.        . omega-3 acid ethyl esters (LOVAZA) 1 G capsule Take 1 g by mouth 2 (two) times daily.        . oxyCODONE (OXYCONTIN)   10 MG 12 hr tablet Take 10 mg by mouth every 12 (twelve) hours.        . simvastatin (ZOCOR) 40 MG tablet Take 40 mg by mouth at bedtime.         Current Facility-Administered Medications on File Prior to Visit  Medication Dose Route Frequency Provider Last Rate Last Dose  . morphine 2 MG/ML injection        4 mg at 09/23/11 0820  . oxyCODONE-acetaminophen (PERCOCET) 5-325 MG per tablet 2 tablet  2 tablet Oral Once Kathleen A Hosmer, MD   2 tablet at 09/23/11 0944  . DISCONTD: morphine 4 MG/ML injection 6 mg  6 mg Intravenous Once Kathleen A Hosmer, MD         REVIEW OF SYSTEMS: Cardiovascular: No chest pain, chest pressure, palpitations, orthopnea, or dyspnea on exertion. No claudication or rest pain,  No history of DVT or phlebitis. Pulmonary: No productive cough, asthma or wheezing. Neurologic: No weakness, paresthesias, aphasia, or amaurosis. No dizziness. Hematologic: No bleeding problems or  clotting disorders. Musculoskeletal: No joint pain or joint swelling. Gastrointestinal: No blood in stool or hematemesis Genitourinary: No dysuria or hematuria. Psychiatric:: No history of major depression. Integumentary: Eschar an open wound on right leg Constitutional: No fever or chills.  PHYSICAL EXAMINATION: General: The patient appears their stated age.  Vital signs are BP 142/80  Pulse 105  Temp(Src) 97.6 F (36.4 C) (Oral)  Resp 18  Ht 5' 10" (1.778 m)  Wt 214 lb (97.07 kg)  BMI 30.71 kg/m2  SpO2 93% HEENT:  No gross abnormalities Pulmonary: Respirations are non-labored Abdomen: Soft and non-tender  Musculoskeletal: Exposed hardware right ankle Neurologic: No focal weakness or paresthesias are detected, decreased sensation and motor function of the right foot Skin: Open wound right foot with exposed hardware Psychiatric: The patient has normal affect. Cardiovascular: There is a regular rate and rhythm without significant murmur appreciated. Palpable femoral pulses  Diagnostic Studies: Outside ultrasound shows occluded right superficial femoral artery ankle-brachial indices could not be obtained   Medication Changes: None  Assessment:  Right ankle fracture status post repair with exposed hardware Plan: I will plan on admitting the patient to the hospital for IV antibiotics. I'll consult orthopedics to get their recommendations with regards to removing of the hardware. I would also like them to comment if they feel that this leg is salvageable. In my opinion I feel like we should proceed with revascularization in an attempt at limb salvage. I have scheduled him for arteriogram this Wednesday. I'll obtain x-rays upon his admission of the right ankle.     V. Wells Vallarie Fei IV, M.D. [facility] [place] Office: [phone] Pager:  [phone]    

## 2011-09-24 NOTE — Consult Note (Signed)
Reason for Consult: Gangrene right foot status post ORIF right lateral malleolar fracture Referring Physician: Dr. Durene Cal  [name] is an 76 y.o. male.  HPI: Patient is a 76 year old gentleman who is about a month and a half status post ORIF right ankle fibular fracture. Patient states that postoperatively he started developing ischemic changes. Patient is currently admitted for vascular workup to determine if his limb is salvageable or he has a revascularization candidate  Past Medical History  Diagnosis Date  . Diabetes mellitus   . Hypercholesteremia     Past Surgical History  Procedure Date  . Spine surgery   . Fracture surgery 08-2011    ORIF right ankle    Family History  Problem Relation Age of Onset  . Diabetes Other     Social History:  reports that he has never smoked. He does not have any smokeless tobacco history on file. He reports that he does not drink alcohol or use illicit drugs.  Allergies: No Known Allergies  Medications: I have reviewed the patient's current medications.  Results for orders placed during the hospital encounter of 09/23/11 (from the past 48 hour(s))  BASIC METABOLIC PANEL     Status: Abnormal   Collection Time   09/23/11  4:26 PM      Component Value Range Comment   Sodium 134 (*) 135 - 145 (mEq/L)    Potassium 4.2  3.5 - 5.1 (mEq/L)    Chloride 96  96 - 112 (mEq/L)    CO2 30  19 - 32 (mEq/L)    Glucose, Bld 214 (*) 70 - 99 (mg/dL)    BUN 18  6 - 23 (mg/dL)    Creatinine, Ser 1.61  0.50 - 1.35 (mg/dL)    Calcium 9.5  8.4 - 10.5 (mg/dL)    GFR calc non Af Amer 60 (*) >90 (mL/min)    GFR calc Af Amer 70 (*) >90 (mL/min)   CBC     Status: Abnormal   Collection Time   09/23/11  4:26 PM      Component Value Range Comment   WBC 12.9 (*) 4.0 - 10.5 (K/uL)    RBC 4.11 (*) 4.22 - 5.81 (MIL/uL)    Hemoglobin 11.8 (*) 13.0 - 17.0 (g/dL)    HCT 09.6 (*) 04.5 - 52.0 (%)    MCV 87.1  78.0 - 100.0 (fL)    MCH 28.7  26.0 - 34.0 (pg)     MCHC 33.0  30.0 - 36.0 (g/dL)    RDW 40.9  81.1 - 91.4 (%)    Platelets 429 (*) 150 - 400 (K/uL)     Dg Chest 2 View  09/23/2011  *RADIOLOGY REPORT*  Clinical Data: Preop ankle surgery.  CHEST - 2 VIEW  Comparison: 08/20/2011  Findings: Heart is upper limits normal in size.  Linear densities in the lung bases, similar to prior study, likely scarring.  No effusions.  No acute bony abnormality.  IMPRESSION: Probable bibasilar scarring.  No acute findings or active disease.  Original Report Authenticated By: Cyndie Chime, M.D.   Dg Ankle Complete Right  09/23/2011  *RADIOLOGY REPORT*  Clinical Data: Preop.  RIGHT ANKLE - COMPLETE 3+ VIEW  Comparison: 08/15/2011  Findings: Plate screw fixation device noted within the distal fibula.  Syndesmotic screws extend into the distal tibia.  Near anatomic alignment.  Fracture line within the distal fibula remains evident.  IMPRESSION: Internal fixation of fibular fracture as above.  Fracture line remains evident.  Original  Report Authenticated By: Cyndie Chime, M.D.    Review of Systems  All other systems reviewed and are negative.   Blood pressure 152/75, pulse 95, temperature 99.1 F (37.3 C), temperature source Oral, resp. rate 16, height 5\' 10"  (1.778 m), weight 90.2 kg (198 lb 13.7 oz), SpO2 93.00%. Physical Exam On examination patient's right foot is cold and ischemic. He has essentially Wagner grade 5 ulceration with black eschar and ischemic changes of the entire ankle hindfoot and forefoot. The hardware is exposed laterally. Assessment/Plan: Ischemic gangrenous right foot and ankle with exposed hardware. Plan Patient to undergo arteriogram study with vascular vein specialist. Hopefully patient will have sufficient circulation to heal a transtibial amputation. At this point it does not appear that patient has a salvageable foot or ankle. I will followup with you.  [name] V 09/24/2011, 7:26 AM

## 2011-09-24 NOTE — Consult Note (Signed)
Cardiology Consult Note   Patient ID: [name] MRN: [id number], DOB/AGE: [birth year]   Admit date: 09/23/2011 Date of Consult: 09/24/2011  Primary Physician: Charlott Rakes, MD Primary Cardiologist: None  Pt. Profile: [name] is a 76 yo male with PMHx significant for type 2 DM, HL who was admitted by vascular surgery for attempted revascularization and limb salvage vs amputation. Cardiology is consulted for preop assessment.  Problem List: Past Medical History  Diagnosis Date  . Diabetes mellitus   . Hypercholesteremia     Past Surgical History  Procedure Date  . Spine surgery   . Fracture surgery 08-2011    ORIF right ankle     Allergies: No Known Allergies  HPI: He is s/p ORIF R lateral malleolar fracture with exposed hardware on 08/08/11. Postop over the past month, he has noticed increased pain, pallor and prolonged recovery/healing to the right foot. Today, he underwent L femoral artery ultrasound, abdominal aortogram, right leg runoff and second order catheterization revealing occluded R and L superficial femoral arteries. He was deemed a poor candidate for revascularization. Of note, ortho consult feels that a transtibial amputation would be most appropriate as the foot/ankle did not appear salvageable.   He follows up with his PCP in [place] every 4 months who manages his type 2 DM and HL. Pt denies history of heart attack, chest pain, palpitations, DOE, diaphoresis, lightheadedness, orthopnea, PND, increase LE edema. Prior to surgery, he would exercise daily with good tolerance. He has only undergone treadmill stress testing approximately 20-25 years ago which was normal. He does have >30 pack-year history, but does not currently use tobacco. Mother died of an MI at 10, otherwise no significant cardiac family history.   Today he only endorses right foot pain. EKG unremarkable for ischemic changes; tele revealed sinus tachycardia; CXR did not reveal cardiomegaly or  acute findings of active disease, did note bibasilar scarring.   Inpatient Medications:     . enoxaparin  40 mg Subcutaneous Q24H  . enoxaparin  40 mg Subcutaneous Q24H  . famotidine (PEPCID) IV  20 mg Intravenous Q12H  . fentaNYL      . heparin      . hydrALAZINE      . lidocaine      . midazolam      .  morphine injection  4 mg Intravenous Once  . piperacillin-tazobactam (ZOSYN)  IV  3.375 g Intravenous Q8H  . potassium chloride  20-40 mEq Oral Once  . vancomycin  1,500 mg Intravenous Once  . vancomycin  1,000 mg Intravenous Q12H   Prescriptions prior to admission  Medication Sig Dispense Refill  . aspirin EC 81 MG tablet Take 81 mg by mouth every evening.        [name] ibuprofen (ADVIL,MOTRIN) 200 MG tablet Take 400 mg by mouth every 8 (eight) hours as needed. For pain.       [name] insulin aspart protamine-insulin aspart (NOVOLOG 70/30) (70-30) 100 UNIT/ML injection Inject 25 Units into the skin 3 (three) times daily with meals.        . insulin glargine (LANTUS) 100 UNIT/ML injection Inject 30 Units into the skin at bedtime.        . metFORMIN (GLUCOPHAGE) 1000 MG tablet Take 1,000 mg by mouth 2 (two) times daily.        [name] omega-3 acid ethyl esters (LOVAZA) 1 G capsule Take 1 g by mouth 2 (two) times daily.        [name] oxyCODONE (OXYCONTIN) 10  MG 12 hr tablet Take 10 mg by mouth every 12 (twelve) hours.        . simvastatin (ZOCOR) 40 MG tablet Take 40 mg by mouth at bedtime.        . cephALEXin (KEFLEX) 500 MG capsule Take 500 mg by mouth 4 (four) times daily.          Family History  Problem Relation Age of Onset  . Diabetes Other      History   Social History  . Marital Status: Married    Spouse Name: N/A    Number of Children: N/A  . Years of Education: N/A   Occupational History  . Not on file.   Social History Main Topics  . Smoking status: Never Smoker   . Smokeless tobacco: Not on file  . Alcohol Use: No  . Drug Use: No  . Sexually Active:    Other Topics  Concern  . Not on file   Social History Narrative  . No narrative on file     Review of Systems: General: negative for chills, fever, night sweats or weight changes.  Cardiovascular: negative for chest pain, dyspnea on exertion, edema, orthopnea, palpitations, paroxysmal nocturnal dyspnea or shortness of breath Dermatological: negative for rash Respiratory: negative for cough or wheezing Urologic: negative for hematuria Abdominal: negative for nausea, vomiting, diarrhea, bright red blood per rectum, melena, or hematemesis Neurologic: negative for visual changes, syncope, or dizziness All other systems reviewed and are otherwise negative except as noted above.  Physical Exam: Blood pressure 112/61, pulse 96, temperature 97.9 F (36.6 C), temperature source Oral, resp. rate 20, height 5\' 10"  (1.778 m), weight 90.2 kg (198 lb 13.7 oz), SpO2 96.00%.    General: Well developed, well nourished, NAD Head: Normocephalic, atraumatic, sclera non-icteric Neck: Supple. Positive for R carotid bruit. JVD not elevated.  Lungs: Clear bilaterally to auscultation without wheezes, rales, or rhonchi. Breathing is unlabored. Heart: RRR with S1 S2. No murmurs, rubs, or gallops appreciated. Abdomen: Soft, non-tender, non-distended with normoactive bowel sounds. No hepatomegaly. No rebound/guarding. No obvious abdominal masses. Msk:  Strength and tone appears normal for age. Extremities: LE pulses faint. R foot with discoloration, ecchymosis and eschar; no clubbing, cyanosis or edema Neuro: Alert and oriented X 3. Moves all extremities spontaneously. Psych:  Responds to questions appropriately with a normal affect.  Labs: Recent Labs  Basename 09/23/11 1626   WBC 12.9*   HGB 11.8*   HCT 35.8*   MCV 87.1   PLT 429*    Lab 09/23/11 1626  NA 134*  K 4.2  CL 96  CO2 30  BUN 18  CREATININE 1.16  CALCIUM 9.5  PROT --  BILITOT --  ALKPHOS --  ALT --  AST --  AMYLASE --  LIPASE --  GLUCOSE  214*    Radiology/Studies: Dg Chest 2 View  09/23/2011  *RADIOLOGY REPORT*  Clinical Data: Preop ankle surgery.  CHEST - 2 VIEW  Comparison: 08/20/2011  Findings: Heart is upper limits normal in size.  Linear densities in the lung bases, similar to prior study, likely scarring.  No effusions.  No acute bony abnormality.  IMPRESSION: Probable bibasilar scarring.  No acute findings or active disease.  Original Report Authenticated By: Cyndie Chime, M.D.   Dg Ankle Complete Right  09/23/2011  *RADIOLOGY REPORT*  Clinical Data: Preop.  RIGHT ANKLE - COMPLETE 3+ VIEW  Comparison: 08/15/2011  Findings: Plate screw fixation device noted within the distal fibula.  Syndesmotic screws  extend into the distal tibia.  Near anatomic alignment.  Fracture line within the distal fibula remains evident.  IMPRESSION: Internal fixation of fibular fracture as above.  Fracture line remains evident.  Original Report Authenticated By: Cyndie Chime, M.D.    EKG: NSR, 95 bpm, RBBB, no ST-T wave changes Tele: Sinus tachycardia, 100-110 bpm  ASSESSMENT AND PLAN:   [name] is a 76 yo Caucasian male with PMHx significant for type 2 DM and hyperlipidemia s/p R malleolar ORIF with gangrenous R foot admitted for attempted revascularization and limb salvage vs amputation. He has regular follow-up with his PCP in [place] who manages his type 2 DM and HL. Per HPI, he does not have a significant cardiac history. He does have significant cardiac risk factors in type 2 DM, HL and hx of tobacco abuse. He has an instance of normal exercise stress test albeit >20 years ago. Denies ischemic cardiac or CHF symptoms and does not seem to have active cardiac issues. From a cardiac standpoint, if he were to undergo bypass, would prefer to have pharmacologic stress test to assess for perfusion defects. If plan is to undergo amputation, he would not be deemed high risk. Additionally, he would benefit from management of the following  issues:  1. R carotid bruit- on exam  - Consider carotid u/s if not previously done as outpatient  2. Sinus tachycardia- likely secondary to pain, BP adequate, asymptomatic  - Will continue to monitor  3. Type 2 DM- would benefit from controlled blood glucose so as to not inhibit wound healing postoperatively and to reduce risk of infection  - Will continue to hold Metformin, add home insulin regimen  4. HL  - Will add outpatient Zocor   Signed, R. Hurman Horn, PA-C 09/24/2011, 1:09 PM    Attending Note  Patient seen and examined. I have reviewed the history of PA Arguello. I concur with his findings. I have examined the patient. He has prominent ischemic changes in the right foot with necrotic areas consistent with gangrene.  The patient notes that prior to his foot fracture he was active and had no limitation to his activity and did not have any chest pain.  A/P 1. Severe peripheral vascular disease - If decision is made to amputate, he is a low risk and would not recommend additional testing. If limb salvage is being considered, then lexiscan myoview would be recommended prior to any limb revascularization procedure. Please call me if the latter is recommended and I will schedule stress test.

## 2011-09-24 NOTE — Plan of Care (Signed)
Problem: Phase I Progression Outcomes Goal: Clear liquids, advance diet as tolerated Outcome: Not Progressing Pt is NPO

## 2011-09-24 NOTE — Op Note (Signed)
[facility]  Patient name: [name] MRN: [id number] DOB: 27-Dec-1934 Sex: male  09/23/2011 - 09/24/2011 Pre-operative Diagnosis: Right leg ischemia Post-operative diagnosis:  Same Surgeon:  Jorge Ny Procedure Performed:  1.  ultrasound access left femoral artery  2.  abdominal aortogram  3.  right leg runoff  4.  second order catheterization   Indications:  The patient presented to my office yesterday with an ischemic left foot. He had previously undergone ORIF of his right ankle secondary to a fracture. He now has exposed hardware. Ultrasound shows an occluded superficial femoral artery. He comes in today for formal arteriogram  Procedure:  The patient was identified in the holding area and taken to room 8.  The patient was then placed supine on the table and prepped and draped in the usual sterile fashion.  A time out was called.  Ultrasound was used to evaluate the left common femoral artery.  It was patent .  A digital ultrasound image was acquired.  A micropuncture needle was used to access the left common femoral artery under ultrasound guidance.  An 018 wire was advanced without resistance and a micropuncture sheath was placed.  The 018 wire was removed and a benson wire was placed.  The micropuncture sheath was exchanged for a 5 french sheath.  An omniflush catheter was advanced over the wire to the level of L-1.  An abdominal angiogram was obtained.  Next, using the omniflush catheter and a benson wire, the aortic bifurcation was crossed with a 4 French straight catheter and the catheter was placed into theright external iliac artery and right runoff was obtained. Following the procedure, the sheath was removed and the patient taken the holding area stable condition Findings:   Aortogram:  The visualized portion of the suprarenal abdominal aorta showed no significant disease. There is no evidence of renal artery stenosis. The infrarenal  abdominal aorta is ectatic but patent without significant stenosis. Bilateral common and external iliac arteries are patent.  Right Lower Extremity:  The right common femoral artery is small in caliber and heavily calcified. There is a single profunda branch which is diminutive but patent. The right superficial femoral artery is heavily calcified and occludes just distal to its origin. There is reconstitution of the anterior tibial artery which is the dominant runoff to the foot. They're very small vessels out onto the foot.  Left Lower Extremity:  The left common femoral artery is occluded. We were unable to get left leg runoff images to 2 clot within the sheath.  Intervention:  None  Impression:  #1  occluded right superficial femoral artery with anterior tibial reconstitution. Severe disease out on the right foot  #2  occluded left common femoral artery  #3  the patient is not a candidate for percutaneous revascularization    V. Durene Cal, M.D. [facility] [place] Office: [phone] Pager:  [phone]

## 2011-09-25 ENCOUNTER — Encounter: Payer: Self-pay | Admitting: Orthopedic Surgery

## 2011-09-25 DIAGNOSIS — I70269 Atherosclerosis of native arteries of extremities with gangrene, unspecified extremity: Secondary | ICD-10-CM

## 2011-09-25 DIAGNOSIS — I739 Peripheral vascular disease, unspecified: Secondary | ICD-10-CM

## 2011-09-25 DIAGNOSIS — Z01810 Encounter for preprocedural cardiovascular examination: Secondary | ICD-10-CM

## 2011-09-25 DIAGNOSIS — Z01818 Encounter for other preprocedural examination: Secondary | ICD-10-CM

## 2011-09-25 LAB — GLUCOSE, CAPILLARY: Glucose-Capillary: 71 mg/dL (ref 70–99)

## 2011-09-25 LAB — CBC
MCH: 28.2 pg (ref 26.0–34.0)
Platelets: 415 10*3/uL — ABNORMAL HIGH (ref 150–400)
RBC: 3.86 MIL/uL — ABNORMAL LOW (ref 4.22–5.81)
WBC: 12 10*3/uL — ABNORMAL HIGH (ref 4.0–10.5)

## 2011-09-25 LAB — BASIC METABOLIC PANEL
Calcium: 9 mg/dL (ref 8.4–10.5)
GFR calc non Af Amer: 69 mL/min — ABNORMAL LOW (ref >90)
Glucose, Bld: 111 mg/dL — ABNORMAL HIGH (ref 70–99)
Sodium: 137 mEq/L (ref 135–145)

## 2011-09-25 MED ORDER — FAMOTIDINE 20 MG PO TABS
20.0000 mg | ORAL_TABLET | Freq: Two times a day (BID) | ORAL | Status: DC
  Administered 2011-09-25 – 2011-09-27 (×4): 20 mg via ORAL
  Filled 2011-09-25 (×5): qty 1

## 2011-09-25 MED ORDER — MORPHINE SULFATE 2 MG/ML IJ SOLN
2.0000 mg | INTRAMUSCULAR | Status: DC | PRN
Start: 2011-09-25 — End: 2011-09-27
  Administered 2011-09-25: 4 mg via INTRAMUSCULAR
  Filled 2011-09-25 (×3): qty 2

## 2011-09-25 NOTE — Progress Notes (Signed)
Pt pulled out IV, IV team was notified and started another IV. Pt reported that that his arm was burning from Zosyn, pt attempted to pull the IV out again. Pt just pulled out his second IV that was put in by the IV team and refused to get another one.  Macon Lesesne [employer]

## 2011-09-25 NOTE — Progress Notes (Signed)
Utilization review completed. La Shehan, RN, BSN. 09/25/11  

## 2011-09-25 NOTE — Progress Notes (Signed)
SUBJECTIVE: No complaints this am. No chest pain or SOB.   BP 123/74  Pulse 91  Temp(Src) 98.7 F (37.1 C) (Oral)  Resp 14  Ht 5\' 10"  (1.778 m)  Wt 198 lb 13.7 oz (90.2 kg)  BMI 28.53 kg/m2  SpO2 92%  Intake/Output Summary (Last 24 hours) at 09/25/11 1610 Last data filed at 09/25/11 0215  Gross per 24 hour  Intake    240 ml  Output   1675 ml  Net  -1435 ml    PHYSICAL EXAM General: Well developed, well nourished, in no acute distress. Alert and oriented x 3.  Psych:  Good affect, responds appropriately Neck: No JVD. No masses noted.  Lungs: Clear bilaterally with no wheezes or rhonci noted.  Heart: RRR with no murmurs noted. Abdomen: Bowel sounds are present. Soft, non-tender.  Ext: Large eschar over right ankle and foot. No LE edema.   LABS: Basic Metabolic Panel:  Basename 09/24/11 1328 09/23/11 1626  NA -- 134*  K -- 4.2  CL -- 96  CO2 -- 30  GLUCOSE -- 214*  BUN -- 18  CREATININE 0.92 1.16  CALCIUM -- 9.5  MG -- --  PHOS -- --   CBC:  Basename 09/25/11 0555 09/24/11 1328  WBC 12.0* 13.2*  NEUTROABS -- --  HGB 10.9* 11.6*  HCT 33.7* 35.4*  MCV 87.3 86.6  PLT 415* 437*    Current Meds:    . aspirin EC  81 mg Oral QPM  . cephALEXin  500 mg Oral QID  . enoxaparin  40 mg Subcutaneous Q24H  . famotidine (PEPCID) IV  20 mg Intravenous Q12H  . fentaNYL      . heparin      . hydrALAZINE      . insulin aspart protamine-insulin aspart  25 Units Subcutaneous TID WC  . insulin glargine  30 Units Subcutaneous QHS  . lidocaine      . midazolam      .  morphine injection  4 mg Intravenous Once  . omega-3 acid ethyl esters  1 g Oral BID  . piperacillin-tazobactam (ZOSYN)  IV  3.375 g Intravenous Q8H  . simvastatin  40 mg Oral q1800  . vancomycin  1,000 mg Intravenous Q12H  . DISCONTD: enoxaparin  40 mg Subcutaneous Q24H  . DISCONTD: insulin aspart protamine-insulin aspart  25 Units Subcutaneous BID WC  . DISCONTD: insulin glargine  30 Units Subcutaneous  QHS  . DISCONTD: oxyCODONE  10 mg Oral Q12H  . DISCONTD: simvastatin  40 mg Oral QHS     ASSESSMENT AND PLAN:  1. Pre-operative risk assessment: Please see full consult note by Dr. Sharrell Ku from yesterday 09/24/11. If amputation is planned, no further cardiac workup is needed. If limb salvage will be pursued, the patient will need a stress myoview. No other changes overnight. No new cardiac recs.   MCALHANY,CHRISTOPHER  12/19/20127:21 AM

## 2011-09-26 DIAGNOSIS — L97509 Non-pressure chronic ulcer of other part of unspecified foot with unspecified severity: Secondary | ICD-10-CM

## 2011-09-26 DIAGNOSIS — Z01818 Encounter for other preprocedural examination: Secondary | ICD-10-CM

## 2011-09-26 LAB — GLUCOSE, CAPILLARY
Glucose-Capillary: 85 mg/dL (ref 70–99)
Glucose-Capillary: 80 mg/dL (ref 70–99)
Glucose-Capillary: 159 mg/dL — ABNORMAL HIGH (ref 70–99)

## 2011-09-26 LAB — CBC
Platelets: 413 10*3/uL — ABNORMAL HIGH (ref 150–400)
RDW: 13.3 % (ref 11.5–15.5)
WBC: 12.3 10*3/uL — ABNORMAL HIGH (ref 4.0–10.5)

## 2011-09-26 LAB — ABO/RH: ABO/RH(D): B POS

## 2011-09-26 MED ORDER — NYSTATIN 100000 UNIT/GM EX OINT
TOPICAL_OINTMENT | Freq: Two times a day (BID) | CUTANEOUS | Status: DC
  Administered 2011-09-26: 22:00:00 via TOPICAL
  Filled 2011-09-26: qty 15

## 2011-09-26 MED ORDER — MIDAZOLAM HCL 2 MG/2ML IJ SOLN: 1.0000 mg | INTRAMUSCULAR | Status: DC | PRN

## 2011-09-26 MED ORDER — FENTANYL CITRATE 0.05 MG/ML IJ SOLN: 50.0000 ug | INTRAMUSCULAR | Status: DC | PRN

## 2011-09-26 MED ORDER — FLUCONAZOLE IN SODIUM CHLORIDE 400-0.9 MG/200ML-% IV SOLN
400.0000 mg | INTRAVENOUS | Status: AC
  Administered 2011-09-26: 400 mg via INTRAVENOUS
  Filled 2011-09-26: qty 200

## 2011-09-26 MED ORDER — REGADENOSON 0.4 MG/5ML IV SOLN
0.4000 mg | Freq: Once | INTRAVENOUS | Status: DC
  Filled 2011-09-26: qty 5

## 2011-09-26 MED ORDER — LACTATED RINGERS IV SOLN
INTRAVENOUS | Status: DC
  Administered 2011-09-27 (×2): via INTRAVENOUS

## 2011-09-26 NOTE — Progress Notes (Signed)
[facility]  Subjective  - Pt with severe anxiety last night.  Wanted to leave AMA.  Also, said he wanted his leg off.  I spoke extensively with he and his wife and calmed him down.  Today he wants to do everything to save his leg   Physical Exam:  CV: RRR Pulm:  CTA Ext: unchanged, slightly less erythema       Assessment/Plan:  Ischemic R LE  I discussed all of our options.  At this point We will persue revasculartization.  The patient understands that even with tibial bypass he may end up with a BKA vs AKA.  I spoke with cardiology and we will go ahead with stress testing.  Dr Duda will be avalable to remove the exposed hardware if we decide to proceed with bypass on Friday.  BRABHAM IV, V. WELLS 09/26/2011 7:15 AM --  Filed Vitals:   09/26/11 0533  BP: 150/94  Pulse: 106  Temp: 98.1 F (36.7 C)  Resp: 20    Intake/Output Summary (Last 24 hours) at 09/26/11 0715 Last data filed at 09/26/11 0500  Gross per 24 hour  Intake    480 ml  Output   1850 ml  Net  -1370 ml     Laboratory CBC    Component Value Date/Time   WBC 12.0* 09/25/2011 0555   HGB 10.9* 09/25/2011 0555   HCT 33.7* 09/25/2011 0555   PLT 415* 09/25/2011 0555    BMET    Component Value Date/Time   NA 137 09/25/2011 0555   K 3.6 09/25/2011 0555   CL 100 09/25/2011 0555   CO2 30 09/25/2011 0555   GLUCOSE 111* 09/25/2011 0555   BUN 12 09/25/2011 0555   CREATININE 1.04 09/25/2011 0555   CALCIUM 9.0 09/25/2011 0555   GFRNONAA 69* 09/25/2011 0555   GFRAA 80* 09/25/2011 0555    COAG No results found for this basename: INR, PROTIME   No results found for this basename: PTT    Antibiotics Anti-infectives     Start     Dose/Rate Route Frequency Ordered Stop   09/24/11 1515   cephALEXin (KEFLEX) capsule 500 mg  Status:  Discontinued        500 mg Oral 4 times daily 09/24/11 1504 09/25/11 0937   09/24/11 0800   vancomycin (VANCOCIN) IVPB 1000 mg/200 mL  premix        1,000 mg 200 mL/hr over 60 Minutes Intravenous Every 12 hours 09/23/11 1842     09/23/11 1800   vancomycin (VANCOCIN) 1,500 mg in sodium chloride 0.9 % 500 mL IVPB        1,500 mg 250 mL/hr over 120 Minutes Intravenous  Once 09/23/11 1632 09/23/11 2200   09/23/11 1600  piperacillin-tazobactam (ZOSYN) IVPB 3.375 g       3.375 g 12.5 mL/hr over 240 Minutes Intravenous 3 times per day 09/23/11 1527             V. Wells Brabham IV, M.D. [facility] Office: [phone] Pager:  [phone]  

## 2011-09-26 NOTE — Progress Notes (Signed)
ANTIBIOTIC CONSULT NOTE - FOLLOW UP  Pharmacy Consult for Vancomycin & Zosyn Indication: RLE wound  No Known Allergies  Patient Measurements: Height: 5\' 10"  (177.8 cm) Weight: 198 lb 13.7 oz (90.2 kg) IBW/kg (Calculated) : 73   Vital Signs: Temp: 98.1 F (36.7 C) (12/20 0533) Temp src: Oral (12/20 0533) BP: 150/94 mmHg (12/20 0533) Pulse Rate: 106  (12/20 0533) Intake/Output from previous day: 12/19 0701 - 12/20 0700 In: 480 [P.O.:480] Out: 1850 [Urine:1850] Intake/Output from this shift: Total I/O In: 240 [P.O.:240] Out: -   Labs:  Basename 09/25/11 0555 09/24/11 1328 09/23/11 1626  WBC 12.0* 13.2* 12.9*  HGB 10.9* 11.6* 11.8*  PLT 415* 437* 429*  LABCREA -- -- --  CREATININE 1.04 0.92 1.16   Estimated Creatinine Clearance: 70.4 ml/min (by C-G formula based on Cr of 1.04). No results found for this basename: VANCOTROUGH:2,VANCOPEAK:2,VANCORANDOM:2,GENTTROUGH:2,GENTPEAK:2,GENTRANDOM:2,TOBRATROUGH:2,TOBRAPEAK:2,TOBRARND:2,AMIKACINPEAK:2,AMIKACINTROU:2,AMIKACIN:2, in the last 72 hours   Microbiology: No results found for this or any previous visit (from the past 720 hour(s)).  Anti-infectives     Start     Dose/Rate Route Frequency Ordered Stop   09/24/11 1515   cephALEXin (KEFLEX) capsule 500 mg  Status:  Discontinued        500 mg Oral 4 times daily 09/24/11 1504 09/25/11 0937   09/24/11 0800   vancomycin (VANCOCIN) IVPB 1000 mg/200 mL premix        1,000 mg 200 mL/hr over 60 Minutes Intravenous Every 12 hours 09/23/11 1842     09/23/11 1800   vancomycin (VANCOCIN) 1,500 mg in sodium chloride 0.9 % 500 mL IVPB        1,500 mg 250 mL/hr over 120 Minutes Intravenous  Once 09/23/11 1632 09/23/11 2200   09/23/11 1600  piperacillin-tazobactam (ZOSYN) IVPB 3.375 g       3.375 g 12.5 mL/hr over 240 Minutes Intravenous 3 times per day 09/23/11 1527            Assessment: 76 yo M on empiric Zosyn day #4 & Vanc day#3. Renal function stable.  No micro data  ordered.   Goal of Therapy:  Vancomycin trough level 10-15 mcg/ml  Plan:  1) Continue current antibiotic. 2) Check vanc trough with am labs tomorrow. 3) F/U anticipated length of antibiotic therapy planned   Elson Clan 09/26/2011,12:13 PM

## 2011-09-26 NOTE — Progress Notes (Signed)
Patient ID: [name], male   DOB: 20-May-1935, 76 y.o.   MRN: [id number] Patient is scheduled for revascularization for the right lower extremity. Patient has osteomyelitis and exposed hardware over the fibula and partial skin necrosis throughout the hindfoot ankle and forefoot. Will plan for removal of the retained hardware removal of the fibula placement of a wound VAC and placement of a Steinmann pin from the calcaneus to the tibia. We will do this in conjunction with Dr. Myra Gianotti.

## 2011-09-26 NOTE — Progress Notes (Signed)
SUBJECTIVE: No chest pain or SOB.   BP 150/94  Pulse 106  Temp(Src) 98.1 F (36.7 C) (Oral)  Resp 20  Ht 5\' 10"  (1.778 m)  Wt 198 lb 13.7 oz (90.2 kg)  BMI 28.53 kg/m2  SpO2 93%  Intake/Output Summary (Last 24 hours) at 09/26/11 1238 Last data filed at 09/26/11 1200  Gross per 24 hour  Intake    480 ml  Output   1350 ml  Net   -870 ml    PHYSICAL EXAM General: Well developed, well nourished, in no acute distress. Alert and oriented x 3.  Psych:  Good affect, responds appropriately Neck: No JVD. No masses noted.  Lungs: Clear bilaterally with no wheezes or rhonci noted.  Heart: RRR with no murmurs noted. Abdomen: Bowel sounds are present. Soft, non-tender.  Extremities: No lower extremity edema. Right foot with large eschar.   LABS: Basic Metabolic Panel:  Basename 09/25/11 0555 09/24/11 1328 09/23/11 1626  NA 137 -- 134*  K 3.6 -- 4.2  CL 100 -- 96  CO2 30 -- 30  GLUCOSE 111* -- 214*  BUN 12 -- 18  CREATININE 1.04 0.92 --  CALCIUM 9.0 -- 9.5  MG -- -- --  PHOS -- -- --   CBC:  Basename 09/25/11 0555 09/24/11 1328  WBC 12.0* 13.2*  NEUTROABS -- --  HGB 10.9* 11.6*  HCT 33.7* 35.4*  MCV 87.3 86.6  PLT 415* 437*    Current Meds:    . aspirin EC  81 mg Oral QPM  . enoxaparin  40 mg Subcutaneous Q24H  . famotidine  20 mg Oral BID  . insulin aspart protamine-insulin aspart  25 Units Subcutaneous TID WC  . insulin glargine  30 Units Subcutaneous QHS  . omega-3 acid ethyl esters  1 g Oral BID  . piperacillin-tazobactam (ZOSYN)  IV  3.375 g Intravenous Q8H  . regadenoson  0.4 mg Intravenous Once  . simvastatin  40 mg Oral q1800  . vancomycin  1,000 mg Intravenous Q12H  . DISCONTD: famotidine (PEPCID) IV  20 mg Intravenous Q12H     ASSESSMENT AND PLAN:  1. Pre-operative risk assessment: Please see full consult note by Dr. Sharrell Ku from 09/24/11. This patient has at least moderate cardiac risk with a revascularization/limb salvage type procedure. If  amputation is planned, no further cardiac workup is needed. If limb salvage will be pursued, the patient will need a stress myoview prior to the procedure to risk stratify.  No new cardiac recs.    [name]  12/20/201212:38 PM

## 2011-09-26 NOTE — Progress Notes (Signed)
Call Dr. Myra Gianotti about order for Lacter Ringers that anesthesilogist order. He said if the patient isn't getting any fluids LR can be started after midnight at 67ml/hr. As far as knew the patient was not getting fluids beside antibiotics.

## 2011-09-26 NOTE — Progress Notes (Signed)
  Bilateral lower extremity greater saphenous vein mapping completed.  Greater saphenous vein was easily compressible and did not appear thickened.  There did appear to be a little more branching in the LEFT calf area. Right  GSV        Left 5.68 mm Origin       4.76 mm 3.03 mm prox thigh      2.91 mm 2.79 mm Mid thigh      2.25 mm 2.89 mm Distal thigh           3.24 mm 3.48 mm Knee                  4.22mm 3.23 mm prox calf      3.18 mm 2.94 mm Mid thigh               2.38 mm 2.39 mm Ankle                  3.18 mm   Vanna Scotland 09/26/2011, 5:59 PM

## 2011-09-26 NOTE — Anesthesia Preprocedure Evaluation (Addendum)
Anesthesia Evaluation  Patient identified by MRN, date of birth, ID band Patient awake    Reviewed: Allergy & Precautions, H&P , NPO status , Patient's Chart, lab work & pertinent test results  Airway Mallampati: II  Neck ROM: full  Mouth opening: Limited Mouth Opening  Dental  (+) Edentulous Upper and Edentulous Lower   Pulmonary          Cardiovascular  PAD   Neuro/Psych    GI/Hepatic   Endo/Other  Diabetes mellitus-, Type 2, Insulin Dependent  Renal/GU      Musculoskeletal   Abdominal   Peds  Hematology   Anesthesia Other Findings   Reproductive/Obstetrics                         Anesthesia Physical Anesthesia Plan  ASA: II  Anesthesia Plan: General   Post-op Pain Management:    Induction: Intravenous  Airway Management Planned: Oral ETT  Additional Equipment:   Intra-op Plan:   Post-operative Plan: Extubation in OR  Informed Consent: I have reviewed the patients History and Physical, chart, labs and discussed the procedure including the risks, benefits and alternatives for the proposed anesthesia with the patient or authorized representative who has indicated his/her understanding and acceptance.     Plan Discussed with: CRNA, Surgeon and Anesthesiologist  Anesthesia Plan Comments:        Anesthesia Quick Evaluation

## 2011-09-26 NOTE — Progress Notes (Signed)
Chaplain's Note:  Visited with pt on request of his church.  Pt was worried about losing his foot. I provided emotional and spiritual support to pt and his wife.  Please call me if I am needed or requested.  09/26/11 1126  Clinical Encounter Type  Visited With Patient and family together  Visit Type Spiritual support  Referral From Patient's clergy  Spiritual Encounters  Spiritual Needs Emotional;Prayer  Stress Factors  Patient Stress Factors Major life changes;Loss of control;Lack of knowledge;Health changes  Family Stress Factors Exhausted;Health changes;Loss of control;Major life changes   Faatimah Spielberg [phone]/  On call [phone]

## 2011-09-27 ENCOUNTER — Other Ambulatory Visit: Payer: Self-pay | Admitting: Orthopedic Surgery

## 2011-09-27 ENCOUNTER — Encounter (HOSPITAL_COMMUNITY): Payer: Self-pay | Admitting: Anesthesiology

## 2011-09-27 ENCOUNTER — Inpatient Hospital Stay (HOSPITAL_COMMUNITY): Payer: Medicare Other | Admitting: Anesthesiology

## 2011-09-27 ENCOUNTER — Encounter (HOSPITAL_COMMUNITY)
Admission: AD | Disposition: A | Discharge status: Home Health | Payer: Self-pay | Source: Ambulatory Visit | Attending: Surgery

## 2011-09-27 DIAGNOSIS — I743 Embolism and thrombosis of arteries of the lower extremities: Secondary | ICD-10-CM

## 2011-09-27 HISTORY — PX: TIBIA IM NAIL INSERTION: SHX2516

## 2011-09-27 HISTORY — PX: FEMORAL-TIBIAL BYPASS GRAFT: SHX938

## 2011-09-27 LAB — COMPREHENSIVE METABOLIC PANEL
AST: 34 U/L (ref 0–37)
Albumin: 2.2 g/dL — ABNORMAL LOW (ref 3.5–5.2)
Alkaline Phosphatase: 84 U/L (ref 39–117)
Chloride: 99 mEq/L (ref 96–112)
Potassium: 3.5 mEq/L (ref 3.5–5.1)
Sodium: 134 mEq/L — ABNORMAL LOW (ref 135–145)
Total Bilirubin: 0.3 mg/dL (ref 0.3–1.2)
Total Protein: 7.1 g/dL (ref 6.0–8.3)

## 2011-09-27 LAB — POCT I-STAT 4, (NA,K, GLUC, HGB,HCT)
Glucose, Bld: 113 mg/dL — ABNORMAL HIGH (ref 70–99)
HCT: 34 % — ABNORMAL LOW (ref 39.0–52.0)
Hemoglobin: 11.6 g/dL — ABNORMAL LOW (ref 13.0–17.0)
Potassium: 4.4 mEq/L (ref 3.5–5.1)

## 2011-09-27 LAB — GLUCOSE, CAPILLARY: Glucose-Capillary: 123 mg/dL — ABNORMAL HIGH (ref 70–99)

## 2011-09-27 LAB — VANCOMYCIN, TROUGH: Vancomycin Tr: 26.5 ug/mL (ref 10.0–20.0)

## 2011-09-27 SURGERY — CREATION, BYPASS, ARTERIAL, FEMORAL TO TIBIAL, USING GRAFT
Anesthesia: General | Site: Leg Upper | Laterality: Right

## 2011-09-27 MED ORDER — INSULIN GLARGINE 100 UNIT/ML ~~LOC~~ SOLN
40.0000 [IU] | Freq: Every day | SUBCUTANEOUS | Status: DC
  Administered 2011-09-29 – 2011-10-01 (×4): 40 [IU] via SUBCUTANEOUS
  Filled 2011-09-27 (×2): qty 3

## 2011-09-27 MED ORDER — ENOXAPARIN SODIUM 40 MG/0.4ML ~~LOC~~ SOLN
40.0000 mg | SUBCUTANEOUS | Status: DC
  Administered 2011-09-28 – 2011-10-02 (×5): 40 mg via SUBCUTANEOUS
  Filled 2011-09-27 (×6): qty 0.4

## 2011-09-27 MED ORDER — GUAIFENESIN-DM 100-10 MG/5ML PO SYRP: 15.0000 mL | ORAL_SOLUTION | ORAL | Status: DC | PRN

## 2011-09-27 MED ORDER — METOPROLOL TARTRATE 1 MG/ML IV SOLN
2.0000 mg | INTRAVENOUS | Status: DC | PRN
  Administered 2011-09-28: 5 mg via INTRAVENOUS
  Filled 2011-09-27: qty 5

## 2011-09-27 MED ORDER — ROCURONIUM BROMIDE 100 MG/10ML IV SOLN
INTRAVENOUS | Status: DC | PRN
  Administered 2011-09-27: 50 mg via INTRAVENOUS

## 2011-09-27 MED ORDER — PROTAMINE SULFATE 10 MG/ML IV SOLN
INTRAVENOUS | Status: DC | PRN
  Administered 2011-09-27: 50 mg via INTRAVENOUS

## 2011-09-27 MED ORDER — ONDANSETRON HCL 4 MG/2ML IJ SOLN: 4.0000 mg | Freq: Four times a day (QID) | INTRAMUSCULAR | Status: DC | PRN

## 2011-09-27 MED ORDER — MIDAZOLAM HCL 5 MG/5ML IJ SOLN
INTRAMUSCULAR | Status: DC | PRN
  Administered 2011-09-27: 2 mg via INTRAVENOUS

## 2011-09-27 MED ORDER — HYDRALAZINE HCL 20 MG/ML IJ SOLN
10.0000 mg | INTRAMUSCULAR | Status: DC | PRN
  Filled 2011-09-27: qty 0.5

## 2011-09-27 MED ORDER — PIPERACILLIN-TAZOBACTAM 3.375 G IVPB
3.3750 g | Freq: Three times a day (TID) | INTRAVENOUS | Status: DC
  Administered 2011-09-28 – 2011-10-03 (×17): 3.375 g via INTRAVENOUS
  Filled 2011-09-27 (×21): qty 50

## 2011-09-27 MED ORDER — SODIUM CHLORIDE 0.9 % IR SOLN
Status: DC | PRN
  Administered 2011-09-27: 1000 mL

## 2011-09-27 MED ORDER — PROPOFOL 10 MG/ML IV EMUL
INTRAVENOUS | Status: DC | PRN
  Administered 2011-09-27: 150 mg via INTRAVENOUS

## 2011-09-27 MED ORDER — ACETAMINOPHEN 650 MG RE SUPP: 325.0000 mg | RECTAL | Status: DC | PRN

## 2011-09-27 MED ORDER — POTASSIUM CHLORIDE CRYS ER 20 MEQ PO TBCR: 20.0000 meq | EXTENDED_RELEASE_TABLET | Freq: Once | ORAL | Status: AC | PRN

## 2011-09-27 MED ORDER — ONDANSETRON HCL 4 MG/2ML IJ SOLN
INTRAMUSCULAR | Status: DC | PRN
  Administered 2011-09-27: 4 mg via INTRAVENOUS

## 2011-09-27 MED ORDER — SODIUM CHLORIDE 0.9 % IV SOLN: 500.0000 mL | Freq: Once | INTRAVENOUS | Status: AC | PRN

## 2011-09-27 MED ORDER — PHENOL 1.4 % MT LIQD: 1.0000 | OROMUCOSAL | Status: DC | PRN

## 2011-09-27 MED ORDER — INSULIN ASPART PROT & ASPART (70-30 MIX) 100 UNIT/ML ~~LOC~~ SUSP: 25.0000 [IU] | SUBCUTANEOUS | Status: DC

## 2011-09-27 MED ORDER — HEPARIN SODIUM (PORCINE) 5000 UNIT/ML IJ SOLN
INTRAMUSCULAR | Status: DC | PRN
  Administered 2011-09-27: 15:00:00

## 2011-09-27 MED ORDER — PHENYLEPHRINE HCL 10 MG/ML IJ SOLN
INTRAMUSCULAR | Status: DC | PRN
  Administered 2011-09-27: 80 ug via INTRAVENOUS
  Administered 2011-09-27: 40 ug via INTRAVENOUS
  Administered 2011-09-27: 80 ug via INTRAVENOUS
  Administered 2011-09-27: 40 ug via INTRAVENOUS
  Administered 2011-09-27 (×2): 80 ug via INTRAVENOUS
  Administered 2011-09-27: 40 ug via INTRAVENOUS

## 2011-09-27 MED ORDER — FAMOTIDINE IN NACL 20-0.9 MG/50ML-% IV SOLN
20.0000 mg | Freq: Two times a day (BID) | INTRAVENOUS | Status: DC
  Administered 2011-09-28 (×3): 20 mg via INTRAVENOUS
  Filled 2011-09-27 (×5): qty 50

## 2011-09-27 MED ORDER — OXYCODONE-ACETAMINOPHEN 5-325 MG PO TABS
1.0000 | ORAL_TABLET | ORAL | Status: DC | PRN
  Administered 2011-09-30 – 2011-10-02 (×3): 2 via ORAL
  Filled 2011-09-27 (×3): qty 2

## 2011-09-27 MED ORDER — HEMOSTATIC AGENTS (NO CHARGE) OPTIME
TOPICAL | Status: DC | PRN
  Administered 2011-09-27 (×2): 2 via TOPICAL

## 2011-09-27 MED ORDER — MORPHINE SULFATE 2 MG/ML IJ SOLN
2.0000 mg | INTRAMUSCULAR | Status: DC | PRN
  Administered 2011-09-28 – 2011-09-30 (×2): 4 mg via INTRAVENOUS
  Administered 2011-09-30: 2 mg via INTRAVENOUS
  Administered 2011-09-30: 4 mg via INTRAVENOUS
  Administered 2011-09-30: 2 mg via INTRAVENOUS
  Administered 2011-10-01 (×2): 4 mg via INTRAVENOUS
  Administered 2011-10-02 (×2): 2 mg via INTRAVENOUS
  Administered 2011-10-02: 4 mg via INTRAVENOUS
  Administered 2011-10-02: 2 mg via INTRAVENOUS
  Filled 2011-09-27: qty 2
  Filled 2011-09-27 (×2): qty 1
  Filled 2011-09-27: qty 2
  Filled 2011-09-27: qty 1
  Filled 2011-09-27: qty 2
  Filled 2011-09-27 (×2): qty 1
  Filled 2011-09-27 (×3): qty 2
  Filled 2011-09-27: qty 1
  Filled 2011-09-27: qty 2

## 2011-09-27 MED ORDER — LACTATED RINGERS IV SOLN: INTRAVENOUS | Status: DC

## 2011-09-27 MED ORDER — LACTATED RINGERS IV SOLN
INTRAVENOUS | Status: DC | PRN
  Administered 2011-09-27 (×2): via INTRAVENOUS

## 2011-09-27 MED ORDER — LABETALOL HCL 5 MG/ML IV SOLN
10.0000 mg | INTRAVENOUS | Status: DC | PRN
  Filled 2011-09-27: qty 4

## 2011-09-27 MED ORDER — DEXTROSE 5 % IV SOLN
1.5000 g | Freq: Two times a day (BID) | INTRAVENOUS | Status: DC
  Filled 2011-09-27 (×2): qty 1.5

## 2011-09-27 MED ORDER — PIPERACILLIN-TAZOBACTAM 3.375 G IVPB 30 MIN
INTRAVENOUS | Status: DC | PRN
  Administered 2011-09-27: 3.375 g via INTRAVENOUS

## 2011-09-27 MED ORDER — HEPARIN SODIUM (PORCINE) 1000 UNIT/ML IJ SOLN
INTRAMUSCULAR | Status: DC | PRN
  Administered 2011-09-27: 3000 [IU] via INTRAVENOUS
  Administered 2011-09-27: 6000 [IU] via INTRAVENOUS

## 2011-09-27 MED ORDER — FENTANYL CITRATE 0.05 MG/ML IJ SOLN
INTRAMUSCULAR | Status: DC | PRN
  Administered 2011-09-27: 150 ug via INTRAVENOUS
  Administered 2011-09-27 (×2): 50 ug via INTRAVENOUS
  Administered 2011-09-27: 100 ug via INTRAVENOUS
  Administered 2011-09-27: 50 ug via INTRAVENOUS
  Administered 2011-09-27: 100 ug via INTRAVENOUS
  Administered 2011-09-27 (×5): 50 ug via INTRAVENOUS

## 2011-09-27 MED ORDER — SODIUM CHLORIDE 0.9 % IV SOLN
INTRAVENOUS | Status: DC
  Administered 2011-09-27: via INTRAVENOUS
  Administered 2011-09-29: 75 mL/h via INTRAVENOUS
  Administered 2011-09-29: 1000 mL via INTRAVENOUS

## 2011-09-27 MED ORDER — ALBUMIN HUMAN 5 % IV SOLN
INTRAVENOUS | Status: DC | PRN
  Administered 2011-09-27 (×2): via INTRAVENOUS

## 2011-09-27 MED ORDER — HYDROMORPHONE HCL PF 1 MG/ML IJ SOLN: 0.2500 mg | INTRAMUSCULAR | Status: DC | PRN

## 2011-09-27 MED ORDER — MAGNESIUM SULFATE 40 MG/ML IJ SOLN
2.0000 g | Freq: Once | INTRAMUSCULAR | Status: AC | PRN
  Filled 2011-09-27: qty 50

## 2011-09-27 MED ORDER — MORPHINE SULFATE 4 MG/ML IJ SOLN
INTRAMUSCULAR | Status: AC
  Administered 2011-09-28: 4 mg
  Filled 2011-09-27: qty 1

## 2011-09-27 MED ORDER — SODIUM CHLORIDE 0.9 % IV SOLN
INTRAVENOUS | Status: DC | PRN
  Administered 2011-09-27: 20:00:00 via INTRAVENOUS

## 2011-09-27 MED ORDER — DOCUSATE SODIUM 100 MG PO CAPS
100.0000 mg | ORAL_CAPSULE | Freq: Every day | ORAL | Status: DC
  Administered 2011-09-28 – 2011-10-03 (×4): 100 mg via ORAL
  Filled 2011-09-27 (×6): qty 1

## 2011-09-27 MED ORDER — SODIUM CHLORIDE 0.9 % IV SOLN
1500.0000 mg | INTRAVENOUS | Status: DC
  Administered 2011-09-28 – 2011-10-03 (×6): 1500 mg via INTRAVENOUS
  Filled 2011-09-27 (×7): qty 1500

## 2011-09-27 MED ORDER — ACETAMINOPHEN 325 MG PO TABS
325.0000 mg | ORAL_TABLET | ORAL | Status: DC | PRN
  Administered 2011-09-28 (×2): 650 mg via ORAL
  Filled 2011-09-27 (×2): qty 2

## 2011-09-27 SURGICAL SUPPLY — 89 items
BANDAGE ELASTIC 4 VELCRO ST LF (GAUZE/BANDAGES/DRESSINGS) IMPLANT
BANDAGE ESMARK 6X9 LF (GAUZE/BANDAGES/DRESSINGS) ×2 IMPLANT
BLADE SAW SGTL MED 73X18.5 STR (BLADE) ×3 IMPLANT
BLADE SURG 10 STRL SS (BLADE) ×6 IMPLANT
BNDG COHESIVE 6X5 TAN NS LF (GAUZE/BANDAGES/DRESSINGS) ×3 IMPLANT
BNDG ESMARK 6X9 LF (GAUZE/BANDAGES/DRESSINGS) ×3
CANISTER SUCTION 2500CC (MISCELLANEOUS) ×3 IMPLANT
CANISTER WOUND CARE 500ML ATS (WOUND CARE) ×3 IMPLANT
CATH EMB 5FR 80CM (CATHETERS) ×3 IMPLANT
CLIP TI MEDIUM 24 (CLIP) ×3 IMPLANT
CLIP TI WIDE RED SMALL 24 (CLIP) ×6 IMPLANT
CLOTH BEACON ORANGE TIMEOUT ST (SAFETY) ×3 IMPLANT
COVER SURGICAL LIGHT HANDLE (MISCELLANEOUS) ×15 IMPLANT
CUFF TOURNIQUET SINGLE 24IN (TOURNIQUET CUFF) IMPLANT
CUFF TOURNIQUET SINGLE 34IN LL (TOURNIQUET CUFF) ×3 IMPLANT
CUFF TOURNIQUET SINGLE 44IN (TOURNIQUET CUFF) IMPLANT
DERMABOND ADHESIVE PROPEN (GAUZE/BANDAGES/DRESSINGS) ×2
DERMABOND ADVANCED (GAUZE/BANDAGES/DRESSINGS) ×1
DERMABOND ADVANCED .7 DNX12 (GAUZE/BANDAGES/DRESSINGS) ×2 IMPLANT
DERMABOND ADVANCED .7 DNX6 (GAUZE/BANDAGES/DRESSINGS) ×4 IMPLANT
DRAIN CHANNEL 15F RND FF W/TCR (WOUND CARE) IMPLANT
DRAPE EXTREMITY T 121X128X90 (DRAPE) ×3 IMPLANT
DRAPE INCISE IOBAN 66X45 STRL (DRAPES) ×3 IMPLANT
DRAPE WARM FLUID 44X44 (DRAPE) ×3 IMPLANT
DRAPE X-RAY CASS 24X20 (DRAPES) IMPLANT
DRSG COVADERM 4X10 (GAUZE/BANDAGES/DRESSINGS) IMPLANT
DRSG COVADERM 4X8 (GAUZE/BANDAGES/DRESSINGS) ×6 IMPLANT
DRSG TELFA 4X14 ISLAND ADH (GAUZE/BANDAGES/DRESSINGS) ×3 IMPLANT
DRSG VAC ATS SM SENSATRAC (GAUZE/BANDAGES/DRESSINGS) ×6 IMPLANT
ELECT REM PT RETURN 9FT ADLT (ELECTROSURGICAL) ×3
ELECTRODE REM PT RTRN 9FT ADLT (ELECTROSURGICAL) ×2 IMPLANT
EVACUATOR SILICONE 100CC (DRAIN) IMPLANT
GLOVE BIO SURGEON STRL SZ 6.5 (GLOVE) ×6 IMPLANT
GLOVE BIOGEL M 9.0 STRL STRW (GLOVE) ×6 IMPLANT
GLOVE BIOGEL PI IND STRL 6.5 (GLOVE) ×6 IMPLANT
GLOVE BIOGEL PI IND STRL 7.0 (GLOVE) ×12 IMPLANT
GLOVE BIOGEL PI IND STRL 7.5 (GLOVE) ×4 IMPLANT
GLOVE BIOGEL PI IND STRL 9 (GLOVE) ×2 IMPLANT
GLOVE BIOGEL PI INDICATOR 6.5 (GLOVE) ×3
GLOVE BIOGEL PI INDICATOR 7.0 (GLOVE) ×6
GLOVE BIOGEL PI INDICATOR 7.5 (GLOVE) ×2
GLOVE BIOGEL PI INDICATOR 9 (GLOVE) ×1
GLOVE ECLIPSE 6.5 STRL STRAW (GLOVE) ×3 IMPLANT
GLOVE ORTHOPEDIC STR SZ6.5 (GLOVE) ×3 IMPLANT
GLOVE SURG SS PI 6.5 STRL IVOR (GLOVE) ×3 IMPLANT
GLOVE SURG SS PI 7.5 STRL IVOR (GLOVE) ×6 IMPLANT
GOWN PREVENTION PLUS XXLARGE (GOWN DISPOSABLE) ×12 IMPLANT
GOWN STRL NON-REIN LRG LVL3 (GOWN DISPOSABLE) ×12 IMPLANT
HEMOSTAT SNOW SURGICEL 2X4 (HEMOSTASIS) IMPLANT
HEMOSTAT SURGICEL 2X14 (HEMOSTASIS) IMPLANT
KIT BASIN OR (CUSTOM PROCEDURE TRAY) ×3 IMPLANT
KIT ROOM TURNOVER OR (KITS) ×3 IMPLANT
MARKER GRAFT CORONARY BYPASS (MISCELLANEOUS) IMPLANT
NS IRRIG 1000ML POUR BTL (IV SOLUTION) ×6 IMPLANT
PACK PERIPHERAL VASCULAR (CUSTOM PROCEDURE TRAY) ×3 IMPLANT
PAD ARMBOARD 7.5X6 YLW CONV (MISCELLANEOUS) ×6 IMPLANT
PADDING CAST COTTON 6X4 STRL (CAST SUPPLIES) ×3 IMPLANT
SAGITTAL BLADE-MEDIUM ×3 IMPLANT
SET COLLECT BLD 21X3/4 12 (NEEDLE) IMPLANT
SPONGE LAP 18X18 X RAY DECT (DISPOSABLE) ×3 IMPLANT
STAPLER VISISTAT 35W (STAPLE) ×3 IMPLANT
STOCKINETTE IMPERVIOUS LG (DRAPES) ×3 IMPLANT
STOPCOCK 4 WAY LG BORE MALE ST (IV SETS) IMPLANT
SUCTION FRAZIER TIP 10 FR DISP (SUCTIONS) ×3 IMPLANT
SUT ETHILON 2 0 PSLX (SUTURE) ×9 IMPLANT
SUT ETHILON 3 0 PS 1 (SUTURE) IMPLANT
SUT PROLENE 5 0 C 1 24 (SUTURE) ×3 IMPLANT
SUT PROLENE 5 0 C 1 36 (SUTURE) ×6 IMPLANT
SUT PROLENE 6 0 BV (SUTURE) ×18 IMPLANT
SUT PROLENE 7 0 BV 1 (SUTURE) IMPLANT
SUT PROLENE 7 0 BV1 MDA (SUTURE) ×3 IMPLANT
SUT SILK 2 0 FS (SUTURE) ×12 IMPLANT
SUT SILK 3 0 (SUTURE) ×1
SUT SILK 3-0 18XBRD TIE 12 (SUTURE) ×2 IMPLANT
SUT VIC AB 2-0 CT1 27 (SUTURE) ×6
SUT VIC AB 2-0 CT1 TAPERPNT 27 (SUTURE) ×12 IMPLANT
SUT VIC AB 3-0 SH 27 (SUTURE) ×4
SUT VIC AB 3-0 SH 27X BRD (SUTURE) ×8 IMPLANT
SUT VIC AB 4-0 PS2 27 (SUTURE) ×3 IMPLANT
SUT VICRYL 4-0 PS2 18IN ABS (SUTURE) ×15 IMPLANT
SYR BULB IRRIGATION 50ML (SYRINGE) ×3 IMPLANT
Steinmann Pin ×3 IMPLANT
TAPE UMBILICAL COTTON 1/8X30 (MISCELLANEOUS) ×3 IMPLANT
TOWEL OR 17X24 6PK STRL BLUE (TOWEL DISPOSABLE) ×12 IMPLANT
TOWEL OR 17X26 10 PK STRL BLUE (TOWEL DISPOSABLE) ×9 IMPLANT
TRAY FOLEY CATH 14FRSI W/METER (CATHETERS) ×3 IMPLANT
TUBING EXTENTION W/L.L. (IV SETS) IMPLANT
UNDERPAD 30X30 INCONTINENT (UNDERPADS AND DIAPERS) ×3 IMPLANT
WATER STERILE IRR 1000ML POUR (IV SOLUTION) ×3 IMPLANT

## 2011-09-27 NOTE — Plan of Care (Signed)
Problem: Phase I Progression Outcomes Goal: Clear liquids, advance diet as tolerated NPO for surg

## 2011-09-27 NOTE — Progress Notes (Signed)
ANTIBIOTIC CONSULT NOTE - FOLLOW UP  Pharmacy Consult for Vancomycin & Zosyn Indication: RLE osteomyelitis  No Known Allergies  Patient Measurements: Height: 5\' 10"  (177.8 cm) Weight: 198 lb 13.7 oz (90.2 kg) IBW/kg (Calculated) : 73   Vital Signs: Temp: 98.3 F (36.8 C) (12/21 1005) Temp src: Axillary (12/21 1005) BP: 151/75 mmHg (12/21 1005) Pulse Rate: 96  (12/21 1005) Intake/Output from previous day: 12/20 0701 - 12/21 0700 In: 480 [P.O.:480] Out: 725 [Urine:725] Intake/Output from this shift:    Labs:  Basename 09/26/11 2320 09/26/11 2202 09/25/11 0555 09/24/11 1328  WBC -- 12.3* 12.0* 13.2*  HGB -- 11.1* 10.9* 11.6*  PLT -- 413* 415* 437*  LABCREA -- -- -- --  CREATININE 0.96 -- 1.04 0.92   Estimated Creatinine Clearance: 76.3 ml/min (by C-G formula based on Cr of 0.96).  Basename 09/27/11 0653  VANCOTROUGH 26.5*  VANCOPEAK --  Drue Dun --  GENTTROUGH --  GENTPEAK --  GENTRANDOM --  TOBRATROUGH --  Nolen Mu --  TOBRARND --  AMIKACINPEAK --  AMIKACINTROU --  AMIKACIN --     Microbiology: No results found for this or any previous visit (from the past 720 hour(s)).  Anti-infectives     Start     Dose/Rate Route Frequency Ordered Stop   09/26/11 1700   fluconazole (DIFLUCAN) IVPB 400 mg        400 mg 200 mL/hr over 60 Minutes Intravenous Every 24 hours 09/26/11 1606 09/26/11 1752   09/24/11 1515   cephALEXin (KEFLEX) capsule 500 mg  Status:  Discontinued        500 mg Oral 4 times daily 09/24/11 1504 09/25/11 0937   09/24/11 0800   vancomycin (VANCOCIN) IVPB 1000 mg/200 mL premix  Status:  Discontinued        1,000 mg 200 mL/hr over 60 Minutes Intravenous Every 12 hours 09/23/11 1842 09/27/11 0831   09/23/11 1800   vancomycin (VANCOCIN) 1,500 mg in sodium chloride 0.9 % 500 mL IVPB        1,500 mg 250 mL/hr over 120 Minutes Intravenous  Once 09/23/11 1632 09/23/11 2200   09/23/11 1600  piperacillin-tazobactam (ZOSYN) IVPB 3.375 g       3.375 g 12.5 mL/hr over 240 Minutes Intravenous 3 times per day 09/23/11 1527            Assessment: 76 yo M on empiric Zosyn day #5 & Vanc day#4 for osetomyelitis of RLE. Noted plans for revascularization & removal of hardware/placement of wound VAC.  Vancomycin trough slightly elevated this morning. Renal function stable.  No micro data ordered. RN called to get v.o. To discontinue vancomycin. Pt leaving for OR now.  Goal of Therapy:  Vancomycin trough 15-20  Plan:  1) F/U with MD to discuss vancomycin plan.  Dr Myra Gianotti has been in surgery all morning. Message left with OR staff & vanc dose sent with patient to OR if needed.  2) Continue Zosyn 3.375gm IV q8h- extended interval dosing   Elson Clan 09/27/2011,12:02 PM

## 2011-09-27 NOTE — H&P (View-Only) (Signed)
[facility]  Subjective  - Pt with severe anxiety last night.  Wanted to leave AMA.  Also, said he wanted his leg off.  I spoke extensively with he and his wife and calmed him down.  Today he wants to do everything to save his leg   Physical Exam:  CV: RRR Pulm:  CTA Ext: unchanged, slightly less erythema       Assessment/Plan:  Ischemic R LE  I discussed all of our options.  At this point We will persue revasculartization.  The patient understands that even with tibial bypass he may end up with a BKA vs AKA.  I spoke with cardiology and we will go ahead with stress testing.  Dr Lajoyce Corners will be avalable to remove the exposed hardware if we decide to proceed with bypass on Friday.  BRABHAM IV, V. WELLS 09/26/2011 7:15 AM --  Filed Vitals:   09/26/11 0533  BP: 150/94  Pulse: 106  Temp: 98.1 F (36.7 C)  Resp: 20    Intake/Output Summary (Last 24 hours) at 09/26/11 0715 Last data filed at 09/26/11 0500  Gross per 24 hour  Intake    480 ml  Output   1850 ml  Net  -1370 ml     Laboratory CBC    Component Value Date/Time   WBC 12.0* 09/25/2011 0555   HGB 10.9* 09/25/2011 0555   HCT 33.7* 09/25/2011 0555   PLT 415* 09/25/2011 0555    BMET    Component Value Date/Time   NA 137 09/25/2011 0555   K 3.6 09/25/2011 0555   CL 100 09/25/2011 0555   CO2 30 09/25/2011 0555   GLUCOSE 111* 09/25/2011 0555   BUN 12 09/25/2011 0555   CREATININE 1.04 09/25/2011 0555   CALCIUM 9.0 09/25/2011 0555   GFRNONAA 69* 09/25/2011 0555   GFRAA 80* 09/25/2011 0555    COAG No results found for this basename: INR, PROTIME   No results found for this basename: PTT    Antibiotics Anti-infectives     Start     Dose/Rate Route Frequency Ordered Stop   09/24/11 1515   cephALEXin (KEFLEX) capsule 500 mg  Status:  Discontinued        500 mg Oral 4 times daily 09/24/11 1504 09/25/11 0937   09/24/11 0800   vancomycin (VANCOCIN) IVPB 1000 mg/200 mL  premix        1,000 mg 200 mL/hr over 60 Minutes Intravenous Every 12 hours 09/23/11 1842     09/23/11 1800   vancomycin (VANCOCIN) 1,500 mg in sodium chloride 0.9 % 500 mL IVPB        1,500 mg 250 mL/hr over 120 Minutes Intravenous  Once 09/23/11 1632 09/23/11 2200   09/23/11 1600  piperacillin-tazobactam (ZOSYN) IVPB 3.375 g       3.375 g 12.5 mL/hr over 240 Minutes Intravenous 3 times per day 09/23/11 1527             V. Charlena Cross, M.D. [facility] Office: [phone] Pager:  [phone]

## 2011-09-27 NOTE — Anesthesia Postprocedure Evaluation (Signed)
  Anesthesia Post-op Note  Patient: [name]  Procedure(s) Performed:  BYPASS GRAFT FEMORAL-TIBIAL ARTERY - Procedure started @1607           Femoral/Anterior Tibial Bypass right .  Right Femoral Endarterectomy.; INTRAMEDULLARY (IM) NAIL TIBIAL - Procedure started @1510 -1536 in addition: Removal of Hardware Right Fibula  Patient Location: PACU  Anesthesia Type: General  Level of Consciousness: awake, alert  and oriented  Airway and Oxygen Therapy: Patient Spontanous Breathing and Patient connected to nasal cannula oxygen  Post-op Pain: none  Post-op Assessment: Post-op Vital signs reviewed, Patient's Cardiovascular Status Stable, Respiratory Function Stable, Patent Airway, No signs of Nausea or vomiting and Pain level controlled  Post-op Vital Signs: Reviewed and stable  Complications: No apparent anesthesia complications

## 2011-09-27 NOTE — Preoperative (Signed)
Beta Blockers   Reason not to administer Beta Blockers:Not Applicable 

## 2011-09-27 NOTE — Progress Notes (Signed)
1300- called OR to speak with Dr. Myra Gianotti re: Vanc being d/c'd this am due to trough level being at panic level.  Dr. Ladona Mow) gave order to d/c medication.  Pt was suppose to be in OR at 0950 this am.  Was given nurse in OR room- notified her that i was sending vanc just in case was needed, as pt has osteomyelitis per pharmacist.

## 2011-09-27 NOTE — Transfer of Care (Signed)
Immediate Anesthesia Transfer of Care Note  Patient: [name]  Procedure(s) Performed:  BYPASS GRAFT FEMORAL-TIBIAL ARTERY - Procedure started @1607           Femoral/Anterior Tibial Bypass right .  Right Femoral Endarterectomy.; INTRAMEDULLARY (IM) NAIL TIBIAL - Procedure started @1510 -1536 in addition: Removal of Hardware Right Fibula  Patient Location: PACU  Anesthesia Type: General  Level of Consciousness: awake, alert  and patient cooperative  Airway & Oxygen Therapy: Patient Spontanous Breathing and Patient connected to nasal cannula oxygen  Post-op Assessment: Report given to PACU RN, Post -op Vital signs reviewed and stable and Patient moving all extremities  Post vital signs: Reviewed and stable  Complications: No apparent anesthesia complications

## 2011-09-27 NOTE — Consult Note (Signed)
  Operative note Patient is a 76 year old gentleman who has severe peripheral vascular disease. He is status post open reduction internal fixation for a right fibular fracture. Patient has had progressive ischemic dry gangrenous changes to the right lower extremity the entire plate and fibula to expose. Patient has been seen by Dr. Myra Gianotti. Patient presents at this time for limb salvage surgery with  revascularization with Dr. Myra Gianotti and removal of internal fixation distal fibula excision tibial calcaneal fusion by Dr. Lajoyce Corners.  Risks and benefits were discussed with the patient and his family including infection nonhealing of the wound need for a transtibial amputation. Patient and the family state understand and wish to proceed at this time.  Patient was brought to OR room 6 and underwent a general anesthetic after adequate levels of anesthesia were obtained patient's right lower extremity was prepped using DuraPrep and draped into a sterile field. The necrotic tissue around the plate and fibula was excised in one block of tissue the fibula and deep retained hardware with were also excised. Patient had an equinus contracture and we were unable to get the ankle up to neutral dorsiflexion. The distal tibia and proximal at talar dome were resected perpendicular to the axis of the tibia to allow for the fusion of the ankle at 90. The ankle was placed 90 and a Steinmann pin was placed from the calcaneus into the tibia stabilizing the foot plantar grade at 90. The wound was irrigated. A wound VAC was placed this was set to -75 mm of suction. The wound VAC had good suction. There were still multiple areas of partial thickness necrosis around the ankle and forefoot and hindfoot. The dressings were taken down and the patient's care was then transferred to Dr. Myra Gianotti.  Patient remained intubated.

## 2011-09-27 NOTE — Interval H&P Note (Signed)
History and Physical Interval Note:  09/27/2011 1:34 PM  Georgann Housekeeper  has presented today for surgery, with the diagnosis of PAD  The various methods of treatment have been discussed with the patient and family. After consideration of risks, benefits and other options for treatment, the patient has consented to  Procedure(s): BYPASS GRAFT FEMORAL-TIBIAL ARTERY as a surgical intervention .  The patients' history has been reviewed, patient examined, no change in status, stable for surgery.  I have reviewed the patients' chart and labs.  Questions were answered to the patient's satisfaction.     BRABHAM IV, V. WELLS

## 2011-09-27 NOTE — Op Note (Signed)
[facility]  Patient name: [name] MRN: [id number] DOB: Aug 30, 1935 Sex: male  09/23/2011 - 09/27/2011 Pre-operative Diagnosis: Nonhealing wound right leg Post-operative diagnosis:  Same Surgeon:  Jorge Ny Assistants:  Zenaida Niece Procedure:   Right femoral to anterior tibial bypass graft with reversed ipsilateral greater saphenous vein #2 right common femoral endarterectomy Anesthesia:  Gen. Blood Loss:  See anesthesia record Specimens:  Common femoral plaque  Findings:  Extensive plaque within the common femoral artery. The vein did not distend as much as outlined it was probably 3 mm throughout its length. The patient had an excellent pulse after the case  Indications:  The patient presented earlier this week with infected hardware from a ankle fracture which was exposed. He also had what I felt was superficial ulceration on most of his foot. Angiography revealed him to have an occluded superficial femoral artery with reconstitution of the anterior tibial artery. Had a lengthy discussion with the patient and his wife regarding her treatment options. Regardless the patient understands he is high-risk for amputation however he would like to do everything possible to try to save his leg. I discussed the details and risks of the bypass operation. He is aware that even with a good outcome from his bypass he may ultimately require below-knee amputation. He wished to proceed. Dr.Duda also discussed his portion of the procedure  Procedure:  The patient was identified in the holding area and taken to [facility] OR ROOM 06  The patient was then placed supine on the table. regional anesthesia was administered.  The patient was prepped and draped in the usual sterile fashion.  A time out was called and antibiotics were administered.  Ultrasound was used to mark the course of the saphenous vein in the lower leg. I initially began by exploring the anterior tibial  artery. A lateral incision was made below the knee. Cautery was used to divide the subcutaneous tissue. The fascia was divided with cautery. Muscle bellies were split until I identified the anterior tibial artery. It was mobilized away from paired veins. The artery appeared healthy approximately 2 mm without significant calcification. I then turned my attention towards the groin. A longitudinal incision was made over the palpable pulse the common femoral artery. Cautery was used to divide the subcutaneous tissue. The femoral artery was dissected free the profunda and superficial femoral arteries were each individually isolated. This extensive calcification nearly circumferential within the common femoral artery extending down into the superficial femoral and profunda femoral arteries. The saphenous vein was then dissected out and mobilized up to the saphenofemoral junction. Side branches were divided between 3-0 silk ties. Next through skip incisions the saphenous vein was harvested throughout the leg. Branches were ligated between 3-0 silk ties and metal clips. Once I felt I had adequate length of vein into a J. clamp was placed in the saphenofemoral junction the vein was divided. The saphenofemoral junction was oversewn in 2 layers of 5-0 Prolene. A right angle was placed on the distal vein and it was ligated. The vein was then prepared on the back table. It was distended with heparinized saline however it did not distend as much as I had expected. The vein was nearly uniform in diameter approximately 3 mm. I then created a lateral subcutaneous tunnel for the bypass graft. Once the tunnel was created the patient was given systemic heparinization. I was able to occlude the common femoral artery with a Hanley clamp. The profunda femoral artery  was occluded with a baby Gregory clamp and the superficial femoral artery occluded with a fistula clamp. A #11 blade was used to make an arteriotomy I used tenotomy scissors  to cut through the plaque. Because of the extensive nature of the plaque I was forced to do a endarterectomy of the common femoral artery. I used a Scientist, research (physical sciences) to get into the appropriate plane. In order to get disease out up into the proximal common femoral artery I passed a #5 Fogarty balloon catheter for proximal control and completed as much of an endarterectomy as I could do based on exposure. I then set up for my anastomosis. The vein was placed in a reverse fashion. It was spatulated to fit the size of the arteriotomy an end-to-side anastomosis was created with a running 5-0 Prolene. Prior to completion the appropriate flushing maneuvers were performed and the anastomosis was completed. There was excellent pulsatile flow through the graft. The vein was then brought through the previously created tunnel making sure to maintain proper orientation. I then placed a web roll the proximal thigh as well as a tourniquet. The leg was exsanguinated with an Esmarch. The tourniquet was inflated to 250 mm of pressure. I then performed an arteriotomy in the anterior tibial artery with a #11 blade. This was extended with Potts scissors. The vein was cut to the appropriate length and spatulated to fit the size of the arteriotomy. A running anastomosis was created with 6-0 Prolene. Prior to completion the tourniquet was let down the appropriate flushing maneuvers were performed and the anastomosis was completed. There was excellent pulse within the bypass graft which was easily palpable. It was a brisk signal and anterior tibial artery down near the ankle. At this point I was satisfied with these results. The patient's heparin was reversed with 50 mg of protamine. The vein harvest incisions were closed in 2 layers of 3-0 Vicryl the anterior tibial exposure was closed with a 2-0 Vicryl and 4-0 Vicryl. I could not close the fascia over this incision. The groin was irrigated and once hemostasis was satisfactory it was  closed in multiple layers of 20 and 3-0 Vicryl. Skin was closed with 4-0 Vicryl. The patient tolerated the procedure well was taken to recovery in stable condition   Disposition:  To PACU in stable condition.   Juleen China, M.D. [facility] [place] Office: [phone] Pager:  [phone]

## 2011-09-27 NOTE — Progress Notes (Signed)
ANTIBIOTIC CONSULT NOTE - FOLLOW UP  Pharmacy Consult for Vancomycin & Zosyn Indication: RLE osteomyelitis  No Known Allergies  Patient Measurements: Height: 5\' 10"  (177.8 cm) Weight: 198 lb 13.7 oz (90.2 kg) IBW/kg (Calculated) : 73   Vital Signs: Temp: 98.9 F (37.2 C) (12/21 2145) Temp src: Oral (12/21 1254) BP: 160/84 mmHg (12/21 1254) Pulse Rate: 100  (12/21 1254) Intake/Output from previous day: 12/20 0701 - 12/21 0700 In: 480 [P.O.:480] Out: 725 [Urine:725] Intake/Output from this shift: Total I/O In: 1525 [I.V.:900; Other:125; IV Piggyback:500] Out: 400 [Urine:200; Blood:200]  Labs:  [facility] 09/26/11 2320 09/26/11 2202 09/25/11 0555  WBC -- 12.3* 12.0*  HGB -- 11.1* 10.9*  PLT -- 413* 415*  LABCREA -- -- --  CREATININE 0.96 -- 1.04   Estimated Creatinine Clearance: 76.3 ml/min (by C-G formula based on Cr of 0.96).  Basename 09/27/11 0653  VANCOTROUGH 26.5*  VANCOPEAK --  Drue Dun --  GENTTROUGH --  GENTPEAK --  GENTRANDOM --  TOBRATROUGH --  Nolen Mu --  TOBRARND --  AMIKACINPEAK --  AMIKACINTROU --  AMIKACIN --     Microbiology: No results found for this or any previous visit (from the past 720 hour(s)).  Anti-infectives     Start     Dose/Rate Route Frequency Ordered Stop   09/27/11 2245   cefUROXime (ZINACEF) 1.5 g in dextrose 5 % 50 mL IVPB  Status:  Discontinued        1.5 g 100 mL/hr over 30 Minutes Intravenous Every 12 hours 09/27/11 2231 09/27/11 2233   09/26/11 1700   fluconazole (DIFLUCAN) IVPB 400 mg        400 mg 200 mL/hr over 60 Minutes Intravenous Every 24 hours 09/26/11 1606 09/26/11 1752   09/24/11 1515   cephALEXin (KEFLEX) capsule 500 mg  Status:  Discontinued        500 mg Oral 4 times daily 09/24/11 1504 09/25/11 0937   09/24/11 0800   vancomycin (VANCOCIN) IVPB 1000 mg/200 mL premix  Status:  Discontinued        1,000 mg 200 mL/hr over 60 Minutes Intravenous Every 12 hours 09/23/11 1842 09/27/11 0831   09/23/11 1800   vancomycin (VANCOCIN) 1,500 mg in sodium chloride 0.9 % 500 mL IVPB        1,500 mg 250 mL/hr over 120 Minutes Intravenous  Once 09/23/11 1632 09/23/11 2200   09/23/11 1600   piperacillin-tazobactam (ZOSYN) IVPB 3.375 g        3.375 g 12.5 mL/hr over 240 Minutes Intravenous 3 times per day 09/23/11 1527            Assessment: 76 yo M on empiric Zosyn day #5 & Vanc day#4 for osetomyelitis of RLE. Noted plans for revascularization & removal of hardware/placement of wound VAC.  Vancomycin trough slightly elevated this morning. Renal function stable.  No micro data ordered. RN called to get v.o. To discontinue vancomycin. Pt leaving for OR now.  Goal of Therapy:  Vancomycin trough 15-20  Plan:  1) Spoke with Dr. Edilia Bo.  He recommends continuing vancomycin and zosyn per pharmacy post operatively.  Expect vancomycin level to be adequate for redosing ~ 6 am tomorrow. 2) Decrease vancomycin dosing to 1500 q 24 hrs. 3) Zosyn 3.375 q 8 hrs. 4) F/U cultures and antibiotic plans.   Rajanae Mantia C 09/27/2011,10:45 PM

## 2011-09-27 NOTE — OR Nursing (Signed)
Hardware that was removed from the right fibula was discarded per Dr. Consuelo Pandy

## 2011-09-27 NOTE — Progress Notes (Signed)
Chaplain's Note:  Visited with pt.  Pt waiting for surgery.  Provided spiritual and emotional support to pt and wife.  Please page if needed or requested. [name]  [phone]/ [phone] on call

## 2011-09-28 ENCOUNTER — Inpatient Hospital Stay (HOSPITAL_COMMUNITY): Payer: Medicare Other

## 2011-09-28 DIAGNOSIS — Z48812 Encounter for surgical aftercare following surgery on the circulatory system: Secondary | ICD-10-CM

## 2011-09-28 DIAGNOSIS — I739 Peripheral vascular disease, unspecified: Secondary | ICD-10-CM

## 2011-09-28 LAB — GLUCOSE, CAPILLARY: Glucose-Capillary: 188 mg/dL — ABNORMAL HIGH (ref 70–99)

## 2011-09-28 LAB — BASIC METABOLIC PANEL
CO2: 25 mEq/L (ref 19–32)
Chloride: 101 mEq/L (ref 96–112)
GFR calc Af Amer: >90 mL/min (ref >90)
Sodium: 136 mEq/L (ref 135–145)

## 2011-09-28 LAB — URINALYSIS, ROUTINE W REFLEX MICROSCOPIC
Bilirubin Urine: NEGATIVE
Ketones, ur: NEGATIVE mg/dL
Nitrite: NEGATIVE
Specific Gravity, Urine: 1.022 (ref 1.005–1.030)
pH: 5.5 (ref 5.0–8.0)

## 2011-09-28 LAB — CBC
Platelets: 411 10*3/uL — ABNORMAL HIGH (ref 150–400)
RBC: 3.46 MIL/uL — ABNORMAL LOW (ref 4.22–5.81)
RDW: 13.3 % (ref 11.5–15.5)
WBC: 16.1 10*3/uL — ABNORMAL HIGH (ref 4.0–10.5)

## 2011-09-28 LAB — URINE MICROSCOPIC-ADD ON

## 2011-09-28 MED ORDER — LORAZEPAM 2 MG/ML IJ SOLN
INTRAMUSCULAR | Status: AC
  Filled 2011-09-28: qty 1

## 2011-09-28 MED ORDER — HALOPERIDOL LACTATE 5 MG/ML IJ SOLN
2.0000 mg | Freq: Four times a day (QID) | INTRAMUSCULAR | Status: DC | PRN
  Administered 2011-09-28: 5 mg via INTRAVENOUS
  Filled 2011-09-28 (×3): qty 1

## 2011-09-28 NOTE — Progress Notes (Signed)
Subjective:  Patient lethargic this am and running temp to 101.9 Denies chest pain or dyspnea.  Objective:  Vital Signs in the last 24 hours: Temp:  [98.1 F (36.7 C)-101.9 F (38.8 C)] 101.9 F (38.8 C) (12/22 0734) Pulse Rate:  [100-127] 114  (12/22 0734) Resp:  [18-20] 19  (12/22 0734) BP: (124-160)/(44-84) 124/56 mmHg (12/22 0734) SpO2:  [92 %-97 %] 93 % (12/22 0734) FiO2 (%):  [2 %] 2 % (12/22 0000) Weight:  [195 lb 1.7 oz (88.5 kg)] 195 lb 1.7 oz (88.5 kg) (12/21 2334)  Intake/Output from previous day: 12/21 0701 - 12/22 0700 In: 4718.8 [I.V.:3443.8; IV Piggyback:1150] Out: 2195 [Urine:1865; Blood:330] Intake/Output from this shift: Total I/O In: 225 [I.V.:225] Out: 300 [Urine:300]     . docusate sodium  100 mg Oral Daily  . enoxaparin  40 mg Subcutaneous Q24H  . famotidine (PEPCID) IV  20 mg Intravenous Q12H  . insulin aspart protamine-insulin aspart  25 Units Subcutaneous See admin instructions  . insulin glargine  40 Units Subcutaneous QHS  . LORazepam      . morphine      . piperacillin-tazobactam (ZOSYN)  IV  3.375 g Intravenous Q8H  . vancomycin  1,500 mg Intravenous Q24H  . DISCONTD: aspirin EC  81 mg Oral QPM  . DISCONTD: cefUROXime (ZINACEF)  IV  1.5 g Intravenous Q12H  . DISCONTD: enoxaparin  40 mg Subcutaneous Q24H  . DISCONTD: famotidine  20 mg Oral BID  . DISCONTD: insulin aspart protamine-insulin aspart  25 Units Subcutaneous TID WC  . DISCONTD: insulin glargine  30 Units Subcutaneous QHS  . DISCONTD: nystatin ointment   Topical BID  . DISCONTD: omega-3 acid ethyl esters  1 g Oral BID  . DISCONTD: piperacillin-tazobactam (ZOSYN)  IV  3.375 g Intravenous Q8H  . DISCONTD: regadenoson  0.4 mg Intravenous Once  . DISCONTD: simvastatin  40 mg Oral q1800      . sodium chloride 75 mL/hr at 09/28/11 0100  . lactated ringers    . DISCONTD: sodium chloride 50 mL/hr (09/23/11 1603)  . DISCONTD: lactated ringers 50 mL/hr at 09/27/11 1335     Physical Exam: The patient appears to be in no distress.  Head and neck exam reveals that the pupils are equal and reactive.  The extraocular movements are full.  There is no scleral icterus.  Mouth and pharynx are benign.  No lymphadenopathy.  No carotid bruits.  The jugular venous pressure is normal.  Thyroid is not enlarged or tender.  Chest is clear to percussion and auscultation.  No rales or rhonchi.  Expansion of the chest is symmetrical.  Heart reveals no abnormal lift or heave.  First and second heart sounds are normal.  There is no murmur gallop rub or click.  Abdomen nontender.  Lab Results:  Basename 09/28/11 0042 09/27/11 1852 09/26/11 2202  WBC 16.1* -- 12.3*  HGB 9.5* 11.6* --  PLT 411* -- 413*    Basename 09/28/11 0042 09/27/11 1852 09/26/11 2320  NA 136 139 --  K 3.8 4.4 --  CL 101 -- 99  CO2 25 -- 27  GLUCOSE 200* 113* --  BUN 8 -- 9  CREATININE 0.98 -- 0.96   No results found for this basename: TROPONINI:2,CK,MB:2 in the last 72 hours Hepatic Function Panel  Basename 09/26/11 2320  PROT 7.1  ALBUMIN 2.2*  AST 34  ALT 42  ALKPHOS 84  BILITOT 0.3  BILIDIR --  IBILI --   No results found  for this basename: CHOL in the last 72 hours No results found for this basename: PROTIME in the last 72 hours  Imaging: Imaging results have been reviewed.  Portable chest xray pending.  Cardiac Studies:  Assessment/Plan:  Patient Active Hospital Problem List: PAD (peripheral artery disease) (09/25/2011)   Assessment: Post-op fever.   Plan: Chest xray pending. Sinus tachycardia    Secondary to fever.   LOS: 5 days    Cassell Clement 09/28/2011, 11:26 AM

## 2011-09-28 NOTE — Progress Notes (Signed)
Called to see patient by nurse because concerned about ischemic right foot. Easily palpable graft pulse. Foot warm and pink. Di Kindle. Edilia Bo, MD, FACS Beeper [phone] 09/28/2011

## 2011-09-28 NOTE — Progress Notes (Signed)
VASCULAR PROGRESS NOTE  SUBJECTIVE: confused  PHYSICAL EXAM: Filed Vitals:   09/27/11 2334 09/28/11 0000 09/28/11 0300 09/28/11 0734  BP: 147/62 136/46 132/44 124/56  Pulse: 127 125 123 114  Temp: 98.2 F (36.8 C)  98.6 F (37 C) 101.9 F (38.8 C)  TempSrc: Oral  Oral Oral  Resp: 18 20 20 19   Height: 5\' 8"  (1.727 m)     Weight: 195 lb 1.7 oz (88.5 kg)     SpO2: 97% 95% 92% 93%   Lungs: clear to auscultation Easily palpable graft pulse right lower extremity. Right foot appears adequately perfused. The VAC in place.  LABS: Lab Results  Component Value Date   WBC 16.1* 09/28/2011   HGB 9.5* 09/28/2011   HCT 30.6* 09/28/2011   MCV 88.4 09/28/2011   PLT 411* 09/28/2011   Lab Results  Component Value Date   CREATININE 0.98 09/28/2011   No results found for this basename: INR, PROTIME   CBG (last 3)   Basename 09/27/11 2200 09/27/11 1128 09/27/11 0538  GLUCAP 137* 127* 123*   ASSESSMENT/PLAN: 1. 1 Day Post-Op s/p: BYPASS GRAFT FEMORAL-TIBIAL ARTERY INTRAMEDULLARY (IM) NAIL TIBIAL 2. Patient receiving Haldol for confusion. 3. Graft patent. 4. VAC change Monday Wednesday and Friday 5. Leave on 3300 for now very at  Waverly Ferrari, MD, FACS Beeper: [phone] 09/28/2011

## 2011-09-28 NOTE — Progress Notes (Signed)
VASCULAR LAB PRELIMINARY  PRELIMINARY  PRELIMINARY  PRELIMINARY  ARTERIAL  ABI completed:    RIGHT    LEFT    PRESSURE WAVEFORM  PRESSURE WAVEFORM  BRACHIAL 132 Triphasic BRACHIAL 104 Triphasic  DP   DP -   PT -  PT -   AT 59 Monophasic AT -   PER   PER -   GREAT TOE   GREAT TOE      RIGHT LEFT  ABI 0.45 -     Terance Hart, RVT 09/28/2011, 12:06 PM

## 2011-09-28 NOTE — Progress Notes (Signed)
PT Note  Consult received and then we received a discontinue order. Please reorder PT if patient is appropriate to mobilize. Thanks.  09/28/2011 Edwyna Perfect, PT  Pager [phone]

## 2011-09-28 NOTE — Progress Notes (Signed)
Paged dr Edilia Bo re temp 101.9f, new orders rec'd for urine cx, ua, blood cx, cxr. , will continue to monitor [name], [name]

## 2011-09-28 NOTE — Progress Notes (Signed)
Bladder scanned for , therefore in and out cath as over 6hours foley d/c'd, in and out for 450cc of amber urnie. [name]

## 2011-09-29 LAB — URINE CULTURE
Culture  Setup Time: 201212222155
Culture: NO GROWTH

## 2011-09-29 LAB — GLUCOSE, CAPILLARY
Glucose-Capillary: 150 mg/dL — ABNORMAL HIGH (ref 70–99)
Glucose-Capillary: 153 mg/dL — ABNORMAL HIGH (ref 70–99)
Glucose-Capillary: 140 mg/dL — ABNORMAL HIGH (ref 70–99)

## 2011-09-29 MED ORDER — INSULIN ASPART PROT & ASPART (70-30 MIX) 100 UNIT/ML ~~LOC~~ SUSP: 25.0000 [IU] | SUBCUTANEOUS | Status: DC

## 2011-09-29 MED ORDER — INSULIN ASPART 100 UNIT/ML ~~LOC~~ SOLN
0.0000 [IU] | Freq: Three times a day (TID) | SUBCUTANEOUS | Status: DC
  Administered 2011-09-29: 2 [IU] via SUBCUTANEOUS
  Administered 2011-09-29: 3 [IU] via SUBCUTANEOUS
  Administered 2011-09-30 – 2011-10-02 (×3): 2 [IU] via SUBCUTANEOUS
  Administered 2011-10-03 (×2): 3 [IU] via SUBCUTANEOUS
  Filled 2011-09-29 (×2): qty 3

## 2011-09-29 MED ORDER — FAMOTIDINE 20 MG PO TABS
20.0000 mg | ORAL_TABLET | Freq: Two times a day (BID) | ORAL | Status: DC
  Administered 2011-09-29 – 2011-10-03 (×8): 20 mg via ORAL
  Filled 2011-09-29 (×10): qty 1

## 2011-09-29 MED ORDER — TAMSULOSIN HCL 0.4 MG PO CAPS
0.4000 mg | ORAL_CAPSULE | Freq: Every day | ORAL | Status: DC
  Administered 2011-09-29 – 2011-10-03 (×5): 0.4 mg via ORAL
  Filled 2011-09-29 (×5): qty 1

## 2011-09-29 NOTE — Progress Notes (Signed)
VASCULAR PROGRESS NOTE  SUBJECTIVE: Confusion much improved today.  PHYSICAL EXAM: Filed Vitals:   09/29/11 0000 09/29/11 0400 09/29/11 0742 09/29/11 0751  BP: 133/59 139/53 139/59 139/59  Pulse: 99 101 96 97  Temp: 99.5 F (37.5 C) 99.5 F (37.5 C)  100.3 F (37.9 C)  TempSrc: Oral Oral  Oral  Resp: 23 20 22 17   Height:      Weight:      SpO2: 98% 95% 95% 94%   Lungs: clear to auscultation Palpable graft pulse right leg VAC in place  LABS: Lab Results  Component Value Date   WBC 16.1* 09/28/2011   HGB 9.5* 09/28/2011   HCT 30.6* 09/28/2011   MCV 88.4 09/28/2011   PLT 411* 09/28/2011   Lab Results  Component Value Date   CREATININE 0.98 09/28/2011   No results found for this basename: INR, PROTIME   CBG (last 3)   Basename 09/28/11 2234 09/28/11 0209 09/27/11 2200  GLUCAP 154* 188* 137*     ASSESSMENT/PLAN: 1. 2 Days Post-Op s/p: BYPASS GRAFT FEMORAL-ANTERIOR TIBIAL ARTERY 2. Transfer to 2000 3. Ortho wants NWB for now.  4. Flomax for urinary retention 5. Poor po intake, so hold his normal insulin for now.  Waverly Ferrari, MD, FACS Beeper: [phone] 09/29/2011

## 2011-09-29 NOTE — Progress Notes (Signed)
   Subjective:  Much more alert this am.   Denies chest pain or dyspnea.  Objective:  Vital Signs in the last 24 hours: Temp:  [98.8 F (37.1 C)-100.6 F (38.1 C)] 100.3 F (37.9 C) (12/23 0751) Pulse Rate:  [93-103] 97  (12/23 0751) Resp:  [17-23] 17  (12/23 0751) BP: (104-139)/(46-59) 139/59 mmHg (12/23 0751) SpO2:  [94 %-98 %] 94 % (12/23 0751)  Intake/Output from previous day: 12/22 0701 - 12/23 0700 In: 2970 [P.O.:420; I.V.:1800; IV Piggyback:750] Out: 2051 [Urine:1950; Drains:100; Stool:1] Intake/Output from this shift: Total I/O In: 270 [P.O.:120; I.V.:150] Out: -      . docusate sodium  100 mg Oral Daily  . enoxaparin  40 mg Subcutaneous Q24H  . famotidine  20 mg Oral BID  . insulin aspart  0-15 Units Subcutaneous TID WC  . insulin aspart protamine-insulin aspart  25 Units Subcutaneous See admin instructions  . insulin glargine  40 Units Subcutaneous QHS  . LORazepam      . piperacillin-tazobactam (ZOSYN)  IV  3.375 g Intravenous Q8H  . Tamsulosin HCl  0.4 mg Oral QPC supper  . vancomycin  1,500 mg Intravenous Q24H  . DISCONTD: famotidine (PEPCID) IV  20 mg Intravenous Q12H  . DISCONTD: insulin aspart protamine-insulin aspart  25 Units Subcutaneous See admin instructions      . sodium chloride 1,000 mL (09/29/11 0004)  . lactated ringers      Physical Exam: The patient appears to be in no distress.  Head and neck exam reveals that the pupils are equal and reactive.  The extraocular movements are full.  There is no scleral icterus.  Mouth and pharynx are benign.  No lymphadenopathy.  No carotid bruits.  The jugular venous pressure is normal.  Thyroid is not enlarged or tender.  Chest is clear to percussion and auscultation.  No rales or rhonchi.  Expansion of the chest is symmetrical.  Heart reveals no abnormal lift or heave.  First and second heart sounds are normal.  There is no murmur gallop rub or click.  Abdomen nontender.  Lab Results:  Basename  09/28/11 0042 09/27/11 1852 09/26/11 2202  WBC 16.1* -- 12.3*  HGB 9.5* 11.6* --  PLT 411* -- 413*    Basename 09/28/11 0042 09/27/11 1852 09/26/11 2320  NA 136 139 --  K 3.8 4.4 --  CL 101 -- 99  CO2 25 -- 27  GLUCOSE 200* 113* --  BUN 8 -- 9  CREATININE 0.98 -- 0.96   No results found for this basename: TROPONINI:2,CK,MB:2 in the last 72 hours Hepatic Function Panel  Basename 09/26/11 2320  PROT 7.1  ALBUMIN 2.2*  AST 34  ALT 42  ALKPHOS 84  BILITOT 0.3  BILIDIR --  IBILI --   No results found for this basename: CHOL in the last 72 hours No results found for this basename: PROTIME in the last 72 hours  Imaging: Imaging results have been reviewed.  Chest xray yesterday showed no active disease.  Cardiac Studies: Telemetry shows NSR Assessment/Plan:  Patient Active Hospital Problem List: PAD (peripheral artery disease) (09/25/2011)   Assessment: Post-op fever.   Plan: WBC trending down on Zosyn and Vanc. Sinus tachycardia    Improving as temp is coming down.   LOS: 6 days    Cassell Clement 09/29/2011, 10:54 AM

## 2011-09-29 NOTE — Progress Notes (Signed)
Pt transferred to 2040 via bed with daughter and belongings. [name]

## 2011-09-30 DIAGNOSIS — I10 Essential (primary) hypertension: Secondary | ICD-10-CM

## 2011-09-30 LAB — TYPE AND SCREEN
Antibody Screen: NEGATIVE
Unit division: 0

## 2011-09-30 LAB — GLUCOSE, CAPILLARY: Glucose-Capillary: 144 mg/dL — ABNORMAL HIGH (ref 70–99)

## 2011-09-30 MED ORDER — LISINOPRIL 5 MG PO TABS
5.0000 mg | ORAL_TABLET | Freq: Every day | ORAL | Status: DC
  Administered 2011-09-30 – 2011-10-03 (×4): 5 mg via ORAL
  Filled 2011-09-30 (×4): qty 1

## 2011-09-30 MED ORDER — SODIUM CHLORIDE 0.9 % IJ SOLN
3.0000 mL | Freq: Two times a day (BID) | INTRAMUSCULAR | Status: DC
  Administered 2011-09-30 – 2011-10-02 (×4): 3 mL via INTRAVENOUS

## 2011-09-30 NOTE — Progress Notes (Signed)
ANTIBIOTIC CONSULT NOTE - FOLLOW UP  Pharmacy Consult for Vancomycin D#7 & ZosynD#8 Indication: RLE osteomyelitis  No Known Allergies  Patient Measurements: Height: 5\' 8"  (172.7 cm) Weight: 195 lb 1.7 oz (88.5 kg) IBW/kg (Calculated) : 68.4   Vital Signs: Temp: 98.3 F (36.8 C) (12/24 0358) Temp src: Oral (12/24 0358) BP: 152/74 mmHg (12/24 1131) Pulse Rate: 89  (12/24 0358) Intake/Output from previous day: 12/23 0701 - 12/24 0700 In: 2750 [P.O.:300; I.V.:1800; IV Piggyback:650] Out: 1450 [Urine:1450] Intake/Output from this shift:    Labs:  [facility] 09/28/11 0042 09/27/11 1852  WBC 16.1* --  HGB 9.5* 11.6*  PLT 411* --  LABCREA -- --  CREATININE 0.98 --   Estimated Creatinine Clearance: 71.5 ml/min (by C-G formula based on Cr of 0.98).    Microbiology: Recent Results (from the past 720 hour(s))  URINE CULTURE     Status: Normal   Collection Time   09/28/11 11:04 AM      Component Value Range Status Comment   Specimen Description URINE, CATHETERIZED   Final    Special Requests NONE   Final    Setup Time [id number]   Final    Colony Count NO GROWTH   Final    Culture NO GROWTH   Final    Report Status 09/29/2011 FINAL   Final   CULTURE, BLOOD (ROUTINE X 2)     Status: Normal (Preliminary result)   Collection Time   09/28/11 12:30 PM      Component Value Range Status Comment   Specimen Description BLOOD HAND RIGHT   Final    Special Requests BOTTLES DRAWN AEROBIC AND ANAEROBIC 10.0 CC EACH   Final    Setup Time [id number]   Final    Culture     Final    Value:        BLOOD CULTURE RECEIVED NO GROWTH TO DATE CULTURE WILL BE HELD FOR 5 DAYS BEFORE ISSUING A FINAL NEGATIVE REPORT   Report Status PENDING   Incomplete   CULTURE, BLOOD (ROUTINE X 2)     Status: Normal (Preliminary result)   Collection Time   09/28/11 12:36 PM      Component Value Range Status Comment   Specimen Description BLOOD HAND RIGHT   Final    Special Requests BOTTLES DRAWN AEROBIC  AND ANAEROBIC 10.0 CC EACH   Final    Setup Time [id number]   Final    Culture     Final    Value:        BLOOD CULTURE RECEIVED NO GROWTH TO DATE CULTURE WILL BE HELD FOR 5 DAYS BEFORE ISSUING A FINAL NEGATIVE REPORT   Report Status PENDING   Incomplete     Anti-infectives     Start     Dose/Rate Route Frequency Ordered Stop   09/28/11 0600   vancomycin (VANCOCIN) 1,500 mg in sodium chloride 0.9 % 500 mL IVPB        1,500 mg 250 mL/hr over 120 Minutes Intravenous Every 24 hours 09/27/11 2333     09/28/11 0014   piperacillin-tazobactam (ZOSYN) IVPB 3.375 g        3.375 g 12.5 mL/hr over 240 Minutes Intravenous 3 times per day 09/27/11 2333     09/27/11 2245   cefUROXime (ZINACEF) 1.5 g in dextrose 5 % 50 mL IVPB  Status:  Discontinued        1.5 g 100 mL/hr over 30 Minutes Intravenous Every 12 hours 09/27/11 2231 09/27/11 2233  09/26/11 1700   fluconazole (DIFLUCAN) IVPB 400 mg        400 mg 200 mL/hr over 60 Minutes Intravenous Every 24 hours 09/26/11 1606 09/26/11 1752   09/24/11 1515   cephALEXin (KEFLEX) capsule 500 mg  Status:  Discontinued        500 mg Oral 4 times daily 09/24/11 1504 09/25/11 0937   09/24/11 0800   vancomycin (VANCOCIN) IVPB 1000 mg/200 mL premix  Status:  Discontinued        1,000 mg 200 mL/hr over 60 Minutes Intravenous Every 12 hours 09/23/11 1842 09/27/11 0831   09/23/11 1800   vancomycin (VANCOCIN) 1,500 mg in sodium chloride 0.9 % 500 mL IVPB        1,500 mg 250 mL/hr over 120 Minutes Intravenous  Once 09/23/11 1632 09/23/11 2200   09/23/11 1600   piperacillin-tazobactam (ZOSYN) IVPB 3.375 g  Status:  Discontinued        3.375 g 12.5 mL/hr over 240 Minutes Intravenous 3 times per day 09/23/11 1527 09/27/11 2333          Assessment: 76 yo M on empiric Zosyn day #5 & Vanc day#4 for osetomyelitis of RLE. Post fem bypass and foot wound debreidment and VAC in place.  Renal function stable.  Bcx no growth to date.    Goal of Therapy:    Vancomycin trough 15-20  Plan:  1) vancomycin  1500 q 24 hrs. 3) Zosyn 3.375 q 8 hrs. 4) F/U cultures and antibiotic plans.   Marcelino Scot 09/30/2011,1:26 PM

## 2011-09-30 NOTE — Progress Notes (Signed)
Physical Therapy Evaluation Patient Details Name: [name] MRN: [id number] DOB: Dec 18, 1934 Today's Date: 09/30/2011  Problem List:  Patient Active Problem List  Diagnoses  . PAD (peripheral artery disease)  . Pre-operative clearance  . HTN (hypertension)    Past Medical History:  Past Medical History  Diagnosis Date  . Diabetes mellitus   . Hypercholesteremia    Past Surgical History:  Past Surgical History  Procedure Date  . Spine surgery   . Fracture surgery 08-2011    ORIF right ankle    PT Assessment/Plan/Recommendation PT Assessment Clinical Impression Statement: pt is a 76 y/o male adm for BPGing and redo pinning with I and D of Right ankle.  Pt could likely use rehab at SNF, but he wants to go directly home and he and his wife have been dealing with NWB and all the homes stairs  prior to admission.  Recommend HHPT. PT Recommendation/Assessment: Patient will need skilled PT in the acute care venue PT Problem List: Decreased strength;Decreased activity tolerance;Decreased balance;Decreased knowledge of use of DME;Pain;Decreased mobility Barriers to Discharge: Inaccessible home environment Barriers to Discharge Comments: pt and wife have resourcefully managed all the stairs at their home. PT Therapy Diagnosis : Acute pain;Difficulty walking PT Plan PT Frequency: Min 3X/week PT Treatment/Interventions: Gait training;Functional mobility training;Therapeutic activities;Balance training;Patient/family education;Stair training;DME instruction PT Recommendation Follow Up Recommendations: Home health PT (though going to SNF for rehab would be more practical) Equipment Recommended: Tub/shower bench PT Goals  Acute Rehab PT Goals PT Goal Formulation: With patient/family Time For Goal Achievement: 7 days Pt will go Supine/Side to Sit: with modified independence PT Goal: Supine/Side to Sit - Progress: Not met Pt will go Sit to Stand: with min assist PT Goal: Sit to Stand  - Progress: Not met Pt will Transfer Bed to Chair/Chair to Bed: with min assist PT Transfer Goal: Bed to Chair/Chair to Bed - Progress: Not met Pt will Ambulate: >150 feet;with min assist;with rolling walker PT Goal: Ambulate - Progress: Not met Pt will Go Up / Down Stairs: Other (comment);with cane (address up/down the stairs) PT Goal: Up/Down Stairs - Progress: Not met  PT Evaluation Precautions/Restrictions  Precautions Precautions: Fall Required Braces or Orthoses: Yes Other Brace/Splint: camwalker when up walking Restrictions RLE Weight Bearing: Non weight bearing Prior Functioning  Home Living Lives With: Spouse Receives Help From: Family Type of Home: House Home Layout: Multi-level;Other (Comment) (split-level) Alternate Level Stairs-Rails: Right;Left Alternate Level Stairs-Number of Steps: 6-8 Home Access: Level entry Home Adaptive Equipment: Wheelchair - manual;Walker - rolling;Bedside commode/3-in-1 Prior Function Level of Independence: Independent with transfers;Independent with gait;Independent with basic ADLs Able to Take Stairs?: Yes Driving: Yes Cognition Cognition Arousal/Alertness: Awake/alert Overall Cognitive Status: Appears within functional limits for tasks assessed Orientation Level: Oriented X4 Sensation/Coordination Coordination Gross Motor Movements are Fluid and Coordinated: Yes Fine Motor Movements are Fluid and Coordinated: Not tested Extremity Assessment RUE Assessment RUE Assessment: Within Functional Limits LUE Assessment LUE Assessment: Within Functional Limits RLE Assessment RLE Assessment: Within Functional Limits (not tested at ankle) LLE Assessment LLE Assessment: Within Functional Limits Mobility (including Balance) Bed Mobility Bed Mobility: Yes Supine to Sit: 5: Supervision;HOB flat Sitting - Scoot to Edge of Bed: 5: Supervision Transfers Transfers: Yes Sit to Stand: 3: Mod assist Sit to Stand Details (indicate cue type and  reason): vc's for hand placement; manual A for forward w/shift Stand to Sit: 3: Mod assist Ambulation/Gait Ambulation/Gait: No  Posture/Postural Control Posture/Postural Control: No significant limitations Balance Balance Assessed: Yes Static Sitting  Balance Static Sitting - Balance Support: No upper extremity supported Static Sitting - Level of Assistance: 7: Independent Static Sitting - Comment/# of Minutes: 8 Exercise    End of Session PT - End of Session Activity Tolerance: Patient tolerated treatment well Patient left: in bed Nurse Communication: Mobility status for transfers General Behavior During Session: [facility] for tasks performed Cognition: [facility] for tasks performed  Kayvon Mo, Eliseo Gum 09/30/2011, 5:03 PM  09/30/2011  [place]wood Bing, PT [phone] (pager)

## 2011-09-30 NOTE — Progress Notes (Addendum)
SUBJECTIVE: No chest pain or SOB.   BP 149/68  Pulse 89  Temp(Src) 98.3 F (36.8 C) (Oral)  Resp 20  Ht 5\' 8"  (1.727 m)  Wt 195 lb 1.7 oz (88.5 kg)  BMI 29.67 kg/m2  SpO2 93%  Intake/Output Summary (Last 24 hours) at 09/30/11 1610 Last data filed at 09/30/11 0700  Gross per 24 hour  Intake   2750 ml  Output   1450 ml  Net   1300 ml    PHYSICAL EXAM General: Well developed, well nourished, in no acute distress. Alert and oriented x 3.  Psych:  Good affect, responds appropriately Neck: No JVD. No masses noted.  Lungs: Clear bilaterally with no wheezes or rhonci noted.  Heart: RRR with no murmurs noted. Abdomen: Bowel sounds are present. Soft, non-tender.  Extremities: Right lower ext with surgical wound, large eschar foot and ankle.   LABS: Basic Metabolic Panel:  Basename 09/28/11 0042 09/27/11 1852  NA 136 139  K 3.8 4.4  CL 101 --  CO2 25 --  GLUCOSE 200* 113*  BUN 8 --  CREATININE 0.98 --  CALCIUM 8.6 --  MG -- --  PHOS -- --   CBC:  Basename 09/28/11 0042 09/27/11 1852  WBC 16.1* --  NEUTROABS -- --  HGB 9.5* 11.6*  HCT 30.6* 34.0*  MCV 88.4 --  PLT 411* --    Current Meds:    . docusate sodium  100 mg Oral Daily  . enoxaparin  40 mg Subcutaneous Q24H  . famotidine  20 mg Oral BID  . insulin aspart  0-15 Units Subcutaneous TID WC  . insulin glargine  40 Units Subcutaneous QHS  . piperacillin-tazobactam (ZOSYN)  IV  3.375 g Intravenous Q8H  . Tamsulosin HCl  0.4 mg Oral QPC supper  . vancomycin  1,500 mg Intravenous Q24H  . DISCONTD: famotidine (PEPCID) IV  20 mg Intravenous Q12H  . DISCONTD: insulin aspart protamine-insulin aspart  25 Units Subcutaneous See admin instructions  . DISCONTD: insulin aspart protamine-insulin aspart  25 Units Subcutaneous See admin instructions     ASSESSMENT AND PLAN:  1. PAD: s/p fem to anterior tibial bypass 3 days ago. Stable.   2. HTN:  Would recommend starting Lisinopril 5 mg po Qdaily or low dose ARB  for BP control.   We will sign off for now. Please call with questions.     MCALHANY,CHRISTOPHER  12/24/20127:09 AM

## 2011-09-30 NOTE — Progress Notes (Signed)
Patient ID: [name], male   DOB: [birth year], 76 y.o.   MRN: [id number] Vascular Surgery Progress Note  Subjective: 3 days post femoral to anterior tibial bypass graft by Dr. Myra Gianotti and foot debridement by Dr.Duda No specific complaints today by patient Appetite good Patient non-weight bearing  Objective:  Filed Vitals:   09/30/11 0358  BP: 149/68  Pulse: 89  Temp: 98.3 F (36.8 C)  Resp: 20    Alert and oriented x3 Lungs clear Surgical wounds from femoral anterior tibial bypass healing nicely with 3+ graft pulse Back in place in right foot-to be changed today per Dr.Duda   Labs:  Lab 09/28/11 0042 09/26/11 2320 09/25/11 0555  CREATININE 0.98 0.96 1.04    Lab 09/28/11 0042 09/27/11 1852 09/26/11 2320 09/25/11 0555  NA 136 139 134* --  K 3.8 4.4 3.5 --  CL 101 -- 99 100  CO2 25 -- 27 30  BUN 8 -- 9 12  CREATININE 0.98 -- 0.96 1.04  LABGLOM -- -- -- --  GLUCOSE 200* -- -- --  CALCIUM 8.6 -- 8.9 9.0    Lab 09/28/11 0042 09/27/11 1852 09/26/11 2202 09/25/11 0555  WBC 16.1* -- 12.3* 12.0*  HGB 9.5* 11.6* 11.1* --  HCT 30.6* 34.0* 33.3* --  PLT 411* -- 413* 415*   No results found for this basename: INR:3 in the last 168 hours  I/O last 3 completed shifts: In: 4300 [P.O.:300; I.V.:2700; IV Piggyback:1300] Out: 2751 [Urine:2650; Drains:100; Stool:1]  Imaging: Dg Chest Port 1 View  09/28/2011  *RADIOLOGY REPORT*  Clinical Data: Fever  PORTABLE CHEST - 1 VIEW  Comparison: Chest x-ray of 09/23/2011  Findings: No focal infiltrate is seen on this portable film. Mediastinal contours appear stable.  The heart is stable being within upper limits of normal.  No bony abnormality is seen.  IMPRESSION: Stable chest x-ray.  No active lung disease.  Original Report Authenticated By: Juline Patch, M.D.    Assessment/Plan:  POD #3  LOS: 7 days  s/p Procedure(s): BYPASS GRAFT FEMORAL-TIBIAL ARTERY INTRAMEDULLARY (IM) NAIL TIBIAL  Right femoral anterior tibial bypass  graft functioning nicely 3 days postop. Will continue to watch right foot along with Dr. Lajoyce Corners Back to be changed today Limb salvage still very much in question   Josephina Gip, MD 09/30/2011 8:07 AM

## 2011-09-30 NOTE — Progress Notes (Signed)
Patient ID: [name], male   DOB: 08-09-1935, 76 y.o.   MRN: [id number] VAC with good drainage, change VAC today, PT- TDWB right.

## 2011-10-01 LAB — GLUCOSE, CAPILLARY
Glucose-Capillary: 87 mg/dL (ref 70–99)
Glucose-Capillary: 144 mg/dL — ABNORMAL HIGH (ref 70–99)
Glucose-Capillary: 116 mg/dL — ABNORMAL HIGH (ref 70–99)

## 2011-10-01 MED ORDER — RISAQUAD PO CAPS
1.0000 | ORAL_CAPSULE | Freq: Two times a day (BID) | ORAL | Status: DC
  Administered 2011-10-01 – 2011-10-03 (×5): 1 via ORAL
  Filled 2011-10-01 (×6): qty 1

## 2011-10-01 NOTE — Progress Notes (Signed)
Patient ID: [name], male   DOB: [birth year], 76 y.o.   MRN: [id number] Right foot warm with good revasc, ischemic ulcers improving, VAC with good bleeding, will need to continue VAC at D/C, will have this managed by [facility].

## 2011-10-01 NOTE — Progress Notes (Addendum)
[facility] [place]  Progress Note Bypass Surgery  Date of Surgery: 09/23/2011 - 09/27/2011 Procedure: Procedure(s): BYPASS GRAFT FEMORAL-TIBIAL ARTERY INTRAMEDULLARY (IM) NAIL TIBIAL Surgeon: Surgeon(s): Juleen China, MD Nadara Mustard, MD POD : 4 Days Post-Op  History of Present Illness  [name] is a 76 y.o. male who is S/P right Procedure(s): BYPASS GRAFT FEMORAL-TIBIAL ARTERY INTRAMEDULLARY (IM) NAIL TIBIAL surgery.  The patient's wounds are healing well. Patients pain is well controlled.    Significant Diagnostic Studies: CBC    Component Value Date/Time   WBC 16.1* 09/28/2011 0042   RBC 3.46* 09/28/2011 0042   HGB 9.5* 09/28/2011 0042   HCT 30.6* 09/28/2011 0042   PLT 411* 09/28/2011 0042   MCV 88.4 09/28/2011 0042   MCH 27.5 09/28/2011 0042   MCHC 31.0 09/28/2011 0042   RDW 13.3 09/28/2011 0042    BMET    Component Value Date/Time   NA 136 09/28/2011 0042   K 3.8 09/28/2011 0042   CL 101 09/28/2011 0042   CO2 25 09/28/2011 0042   GLUCOSE 200* 09/28/2011 0042   BUN 8 09/28/2011 0042   CREATININE 0.98 09/28/2011 0042   CALCIUM 8.6 09/28/2011 0042   GFRNONAA 79* 09/28/2011 0042   GFRAA >90 09/28/2011 0042    Physical Examination  BP Readings from Last 3 Encounters:  10/01/11 136/72  10/01/11 136/72  10/01/11 136/72   Temp Readings from Last 3 Encounters:  10/01/11 98.6 F (37 C) Oral  10/01/11 98.6 F (37 C) Oral  10/01/11 98.6 F (37 C) Oral   SpO2 Readings from Last 3 Encounters:  10/01/11 90%  10/01/11 90%  10/01/11 90%    Pt is A&O x 3 right lower extremity: Incision/s is/are clean,dry.intact, and  healing without hematoma, erythema or drainage Limb is warm; with good color Groin wound moist   Assessment: Pt. Doing well Post-op pain is controlled Wounds are healing well; groin wound moist  Plan: PT/OT for ambulation Continue wound care as ordered DC foley in am -hx BPH, strict I/O today Paint  groin wound with betadine q shift and place dry ABD in area to keep dry  ROCZNIAK,REGINA J [phone] 10/01/2011 8:27 AM    Palpable lateral graft pulse Large areas of eschar on foot unchanged. Continue current care.  Fabienne Bruns, MD [facility] [place] Office: [phone] Pager: [phone]

## 2011-10-01 NOTE — Progress Notes (Signed)
Chaplain's Note:  Pt was anxiously waiting to know whether the surgery was a success or not.  I provided emotional and spiritual support.  Please page if I am needed or requested. [employer]  [phone]

## 2011-10-02 LAB — GLUCOSE, CAPILLARY
Glucose-Capillary: 91 mg/dL (ref 70–99)
Glucose-Capillary: 86 mg/dL (ref 70–99)

## 2011-10-02 MED ORDER — OXYCODONE-ACETAMINOPHEN 5-325 MG PO TABS: 1.0000 | ORAL_TABLET | ORAL | Status: AC | PRN

## 2011-10-02 MED ORDER — LISINOPRIL 5 MG PO TABS: 5.0000 mg | ORAL_TABLET | Freq: Every day | ORAL | Status: DC

## 2011-10-02 MED ORDER — DOXYCYCLINE HYCLATE 100 MG PO TABS: 100.0000 mg | ORAL_TABLET | Freq: Two times a day (BID) | ORAL | Status: AC

## 2011-10-02 MED ORDER — RISAQUAD PO CAPS: 1.0000 | ORAL_CAPSULE | Freq: Two times a day (BID) | ORAL | Status: DC

## 2011-10-02 NOTE — Progress Notes (Signed)
CARE MANAGEMENT NOTE 10/02/2011  Action/Plan:   Discharge planning. Will speak with patient's wife this afternoon regarding home health needs, pt not ready for discharge today.

## 2011-10-02 NOTE — Progress Notes (Addendum)
[facility] [place]  Progress Note Bypass Surgery  Date of Surgery: 09/23/2011 - 09/27/2011 Procedure: Procedure(s): RIGHT BYPASS GRAFT FEMORAL-TIBIAL ARTERY INTRAMEDULLARY (IM) NAIL TIBIAL Surgeon: Surgeon(s): Juleen China, MD Nadara Mustard, MD POD : 5 Days Post-Op  History of Present Illness  [name] is a 77 y.o. male who is S/P right  BYPASS GRAFT FEMORAL-TIBIAL ARTERY INTRAMEDULLARY (IM) NAIL TIBIAL surgery.  The patient's wounds are healing well. Dr. Lajoyce Corners for wound care to right foot  Patients pain is well controlled.    No results found for this basename: PTT    Physical Examination  BP Readings from Last 3 Encounters:  10/02/11 149/73  10/02/11 149/73  10/02/11 149/73   Temp Readings from Last 3 Encounters:  10/02/11 98.4 F (36.9 C) Oral  10/02/11 98.4 F (36.9 C) Oral  10/02/11 98.4 F (36.9 C) Oral   SpO2 Readings from Last 3 Encounters:  10/02/11 93%  10/02/11 93%  10/02/11 93%   Pt is A&O x 3 right lower extremity: Incision/s is/are clean,dry.intact, and  healing without hematoma, erythema or drainage Limb is warm; with good color: escars right foot stable, wound vac in place  Assessment: Pt. Doing well Post-op pain is controlled Wounds are healing well Bypass is open and extremities are well perfused  Plan: PT/OT for ambulation Continue wound care as ordered DC foley today ? Change to oral antibiotics   [name] [phone] 10/02/2011 9:48 AM        Right lower extremity bypass is patent with 3+ pulse . Surgical wounds from bypass are healing nicely.VAC remains in place in right foot. Foley discontinued today. Discussed situation with Dr. Lajoyce Corners and plan is to discharge patient tomorrow on doxycycline 100 mg by mouth twice a day and will be followed in Dr. Audrie Lia office. We'll also see Dr. Myra Gianotti in 2 weeks for followup.

## 2011-10-02 NOTE — Progress Notes (Signed)
Physical Therapy Treatment Patient Details Name: [name] MRN: [id number] DOB: 1935/07/30 Today's Date: 10/02/2011  PT Assessment/Plan  PT - Assessment/Plan Comments on Treatment Session: Pt with decreased mobility from baseline despite increased time PTA NWB. Provided HEP for pt to perform to maximize strength and function as well as encouraged OOB daily with nursing to increase function. Pt improving but would truly benefit from ST-SNF but pt does not seem receptive. PT Plan: Discharge plan remains appropriate PT Frequency: Min 3X/week PT Goals  Acute Rehab PT Goals PT Goal: Supine/Side to Sit - Progress: Progressing toward goal PT Goal: Sit to Stand - Progress: Progressing toward goal PT Transfer Goal: Bed to Chair/Chair to Bed - Progress: Progressing toward goal Pt will Ambulate: 16 - 50 feet PT Goal: Ambulate - Progress: Revised (modified due to lack of progress/goal met) Pt will Go Up / Down Stairs: Flight (Pt has been bumping up stairs on buttocks. Flight) PT Goal: Up/Down Stairs - Progress: Not met  PT Treatment Precautions/Restrictions  Precautions Precautions: Fall Precaution Comments: wound VAC Required Braces or Orthoses: Yes Other Brace/Splint: camwalker when up walking Restrictions Weight Bearing Restrictions: Yes RLE Weight Bearing: Touchdown weight bearing Mobility (including Balance) Bed Mobility Supine to Sit: 4: Min assist;HOB elevated (Comment degrees) (20 degrees) Supine to Sit Details (indicate cue type and reason): assist to elevate trunk fully off surface with rail and cueing Sitting - Scoot to Edge of Bed: 5: Supervision Transfers Sit to Stand: 3: Mod assist;From bed;From chair/3-in-1 Sit to Stand Details (indicate cue type and reason): elevated bed height to achieve full standing, cueing for use of arm rests from chair with assist to achieved anterior translation Stand to Sit: 4: Min assist;To chair/3-in-1 Stand to Sit Details: use of armrests with  cueing for control and safety Ambulation/Gait Ambulation/Gait: Yes Ambulation/Gait Assistance: 4: Min assist Ambulation/Gait Assistance Details (indicate cue type and reason): cueing for maintaining TDWB Ambulation Distance (Feet): 10 Feet (two trials 4 feet and 10 feet) Assistive device: Rolling walker Gait Pattern:  (NWB RLE with gait) Stairs: No Wheelchair Mobility Wheelchair Mobility: No  Posture/Postural Control Posture/Postural Control: No significant limitations Balance Balance Assessed: No Exercise  General Exercises - Lower Extremity Straight Leg Raises: AROM;Both;10 reps;Seated Hip Flexion/Marching: AROM;Both;Other reps (comment);Seated (15 reps) Other Exercises Other Exercises: arm rest push ups x 10 AROM with VC for technique End of Session PT - End of Session Equipment Utilized During Treatment: Gait belt Activity Tolerance: Patient limited by fatigue Patient left: in chair;with call bell in reach Nurse Communication: Mobility status for transfers;Mobility status for ambulation General Behavior During Session: [facility] for tasks performed Cognition: [facility] for tasks performed  Delorse Lek 10/02/2011, 12:36 PM Toney Sang, PT [phone]

## 2011-10-02 NOTE — Progress Notes (Signed)
Utilization review completed. Travius Crochet, RN, BSN. 10/02/11  

## 2011-10-02 NOTE — Progress Notes (Signed)
Patient ID: [name], male   DOB: 06-26-1935, 76 y.o.   MRN: [id number] Patient without complaints.  Change VAC today.  HHRN for home health, negative pressure wound mgmt, for 8 weeks, change MWF, ordered.

## 2011-10-03 ENCOUNTER — Encounter (HOSPITAL_COMMUNITY): Payer: Self-pay | Admitting: Surgery

## 2011-10-03 LAB — GLUCOSE, CAPILLARY
Glucose-Capillary: 157 mg/dL — ABNORMAL HIGH (ref 70–99)
Glucose-Capillary: 168 mg/dL — ABNORMAL HIGH (ref 70–99)

## 2011-10-03 NOTE — Progress Notes (Signed)
Case Manager has been working with [employer] rep all day to secure a wound vac for home. Contacted BC[employer] and was told they received request for [facility] authorization at 3:18p, they have to review clinicals. CM has spoken to patient and wife explaining the process. Called [employer] rep at 1615 for update. Vac may be released this evening, but unfortunately  most likely will be in the am.

## 2011-10-03 NOTE — Progress Notes (Signed)
Physical Therapy Note: pt "disgusted" over delay in discharge and currently refusing to perform any mobility despite education and encouragement for need to progress mobility. Wife present and states bed downstairs for pt to live on main level now. Will attempt at later date. Toney Sang, PT [phone]

## 2011-10-03 NOTE — Discharge Summary (Signed)
[facility] Discharge Summary  [name] 1935/05/24 76 y.o. male  [id number]  Admission Date: 09/23/2011  Discharge Date: 10/03/11  Physician: Juleen China, MD  Admission Diagnosis: PVD with ulcers PAD   HPI:   This is a 76 y.o. male patient comes in today for a wound evaluation of his right leg. Approximately one month ago he broke his right ankle during a fall he underwent open reduction and internal fixation in [place]. He now has an eschar on the medial and some of his foot as well as exposed hardware. 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 He is having significant pain in his leg. She's been told by the physicians and aspirin that he needs a below knee amputation the wound center felt that this could be salvaged. For that reason the patient was sent to me for further evaluation the patient is highly functional at home prior to this fracture.  The patient is a diabetic his blood sugars range between 1:30 and 150. He told me that his hemoglobin A1c was very high at his most recent visit and his insulin was increased. He also takes a statin for his hypercholesterolemia. He is a nonsmoker.    Hospital Course:  The patient was admitted to the hospital and taken to the [facility] on 09/24/2011   and underwent  1. ultrasound access left femoral artery  2. abdominal aortogram  3. right leg runoff  4. second order catheterization   The pt tolerated the procedure well and was transported to the PACU in good condition. The findings were as follows: #1 occluded right superficial femoral artery with anterior tibial reconstitution. Severe disease out on the right foot  #2 occluded left common femoral artery  #3 the patient is not a candidate for percutaneous revascularization  The pt underwent a cardiology consult for cardiac clearance for surgery.  On 09/27/11, he was taken  to the operating room where he underwent Right femoral to anterior tibial bypass graft with reversed ipsilateral greater saphenous vein #2 right common femoral endarterectomy by Dr.  Myra Gianotti and hardware removed and a steinmann pin placed from the calcaneus into the tibia and a wound vac placed by Dr. Lajoyce Corners.  He tolerated well and was transferred to the PACU in satisfactory condition.  The remainder of his post operative course included PT.  The pt was found to have a right carotid bruit, which we will schedule a carotid u/s in our office at his f/u appt.  The remainder of the hospital course consisted of increasing ambulation and increasing intake of solids without difficulty.    Discharge Instructions:   The patient is discharged to home with extensive instructions on wound care and progressive ambulation.  They are instructed not to drive or perform any heavy lifting until returning to see the physician in his office.  Discharge Orders    Future Appointments: Provider: Department: Dept Phone: Center:   10/21/2011 8:15 AM [facility] [phone] [facility]     Future Orders Please Complete By Expires   Resume previous diet      Driving Restrictions      Comments:   No driving   Call MD for:  temperature >100.5      Call MD for:  redness, tenderness, or signs of infection (pain, swelling, bleeding, redness, odor or green/yellow discharge around incision site)      Call MD  for:  severe or increased pain, loss or decreased feeling  in affected limb(s)      Walk with assistance      Walker       Discharge wound care:      Comments:   Wound vac and other wound care per Dr. Lajoyce Corners   Change dressing (specify)      Comments:   Dressing change: keep dry dressing in right groin      Discharge Diagnosis:  PVD with ulcers PAD  Secondary Diagnosis: Patient Active Problem List  Diagnoses  . PAD (peripheral artery disease)  . Pre-operative clearance  . HTN  (hypertension)   Past Medical History  Diagnosis Date  . Diabetes mellitus   . Hypercholesteremia        [name], [name] Medication Instructions ZOX:[id number]   Printed on:10/03/11 1006  Medication Information                    metFORMIN (GLUCOPHAGE) 1000 MG tablet Take 1,000 mg by mouth 2 (two) times daily.             omega-3 acid ethyl esters (LOVAZA) 1 G capsule Take 1 g by mouth 2 (two) times daily.             aspirin EC 81 MG tablet Take 81 mg by mouth every evening.             oxyCODONE (OXYCONTIN) 10 MG 12 hr tablet Take 10 mg by mouth every 12 (twelve) hours.             simvastatin (ZOCOR) 40 MG tablet Take 40 mg by mouth at bedtime.             insulin aspart protamine-insulin aspart (NOVOLOG 70/30) (70-30) 100 UNIT/ML injection Inject 25 Units into the skin See admin instructions. Takes 25 units twice daily to three times daily if eats a large meal with lunch and/or Supper           insulin glargine (LANTUS) 100 UNIT/ML injection Inject 40 Units into the skin at bedtime.             acidophilus (RISAQUAD) CAPS Take 1 capsule by mouth 2 (two) times daily.           oxyCODONE-acetaminophen (PERCOCET) 5-325 MG per tablet Take 1-2 tablets by mouth every 4 (four) hours as needed.           lisinopril (PRINIVIL,ZESTRIL) 5 MG tablet Take 1 tablet (5 mg total) by mouth daily.           doxycycline (VIBRA-TABS) 100 MG tablet Take 1 tablet (100 mg total) by mouth 2 (two) times daily.             Disposition: home with PT  Patient's condition: is Good  Follow up: 1.  Dr. Myra Gianotti in 2 weeks.  We will also obtain a carotid duplex at that time for a right carotid bruit. 2.  Dr. Lajoyce Corners in 2 weeks.   Newton Pigg, PA-C [facility] [phone] 10/03/2011  10:06 AM

## 2011-10-03 NOTE — Progress Notes (Signed)
Chaplain's Note:  Checked in with pt.  Pt is hoping to go home soon.  Please call if needed or requested. Maylen Waltermire  [phone]/ on call [phone]

## 2011-10-03 NOTE — Progress Notes (Signed)
ANTIBIOTIC CONSULT NOTE - FOLLOW UP  Pharmacy Consult for Vancomycin/Zosyn Indication: RLE osteomyelitis  No Known Allergies  Patient Measurements: Height: 5\' 8"  (172.7 cm) Weight: 195 lb 1.7 oz (88.5 kg) IBW/kg (Calculated) : 68.4   Vital Signs: Temp: 97.1 F (36.2 C) (12/27 0427) Temp src: Oral (12/27 0427) BP: 161/73 mmHg (12/27 0427) Pulse Rate: 94  (12/27 0427) Intake/Output from previous day: 12/26 0701 - 12/27 0700 In: 840 [P.O.:840] Out: 1025 [Urine:1025] Intake/Output from this shift: Total I/O In: -  Out: 350 [Urine:350]  Labs: No results found for this basename: WBC:3,HGB:3,PLT:3,LABCREA:3,CREATININE:3, in the last 72 hours Estimated Creatinine Clearance: 71.5 ml/min (by C-G formula based on Cr of 0.98).    Microbiology: Recent Results (from the past 720 hour(s))  URINE CULTURE     Status: Normal   Collection Time   09/28/11 11:04 AM      Component Value Range Status Comment   Specimen Description URINE, CATHETERIZED   Final    Special Requests NONE   Final    Setup Time [id number]   Final    Colony Count NO GROWTH   Final    Culture NO GROWTH   Final    Report Status 09/29/2011 FINAL   Final   CULTURE, BLOOD (ROUTINE X 2)     Status: Normal (Preliminary result)   Collection Time   09/28/11 12:30 PM      Component Value Range Status Comment   Specimen Description BLOOD HAND RIGHT   Final    Special Requests BOTTLES DRAWN AEROBIC AND ANAEROBIC 10.0 CC EACH   Final    Setup Time [id number]   Final    Culture     Final    Value:        BLOOD CULTURE RECEIVED NO GROWTH TO DATE CULTURE WILL BE HELD FOR 5 DAYS BEFORE ISSUING A FINAL NEGATIVE REPORT   Report Status PENDING   Incomplete   CULTURE, BLOOD (ROUTINE X 2)     Status: Normal (Preliminary result)   Collection Time   09/28/11 12:36 PM      Component Value Range Status Comment   Specimen Description BLOOD HAND RIGHT   Final    Special Requests BOTTLES DRAWN AEROBIC AND ANAEROBIC 10.0 CC EACH    Final    Setup Time [id number]   Final    Culture     Final    Value:        BLOOD CULTURE RECEIVED NO GROWTH TO DATE CULTURE WILL BE HELD FOR 5 DAYS BEFORE ISSUING A FINAL NEGATIVE REPORT   Report Status PENDING   Incomplete     Anti-infectives     Start     Dose/Rate Route Frequency Ordered Stop   10/02/11 0000   doxycycline (VIBRA-TABS) 100 MG tablet        100 mg Oral 2 times daily 10/02/11 1219 10/12/11 2359   09/28/11 0600   vancomycin (VANCOCIN) 1,500 mg in sodium chloride 0.9 % 500 mL IVPB        1,500 mg 250 mL/hr over 120 Minutes Intravenous Every 24 hours 09/27/11 2333     09/28/11 0014   piperacillin-tazobactam (ZOSYN) IVPB 3.375 g        3.375 g 12.5 mL/hr over 240 Minutes Intravenous 3 times per day 09/27/11 2333     09/27/11 2245   cefUROXime (ZINACEF) 1.5 g in dextrose 5 % 50 mL IVPB  Status:  Discontinued        1.5 g 100  mL/hr over 30 Minutes Intravenous Every 12 hours 09/27/11 2231 09/27/11 2233   09/26/11 1700   fluconazole (DIFLUCAN) IVPB 400 mg        400 mg 200 mL/hr over 60 Minutes Intravenous Every 24 hours 09/26/11 1606 09/26/11 1752   09/24/11 1515   cephALEXin (KEFLEX) capsule 500 mg  Status:  Discontinued        500 mg Oral 4 times daily 09/24/11 1504 09/25/11 0937   09/24/11 0800   vancomycin (VANCOCIN) IVPB 1000 mg/200 mL premix  Status:  Discontinued        1,000 mg 200 mL/hr over 60 Minutes Intravenous Every 12 hours 09/23/11 1842 09/27/11 0831   09/23/11 1800   vancomycin (VANCOCIN) 1,500 mg in sodium chloride 0.9 % 500 mL IVPB        1,500 mg 250 mL/hr over 120 Minutes Intravenous  Once 09/23/11 1632 09/23/11 2200   09/23/11 1600   piperacillin-tazobactam (ZOSYN) IVPB 3.375 g  Status:  Discontinued        3.375 g 12.5 mL/hr over 240 Minutes Intravenous 3 times per day 09/23/11 1527 09/27/11 2333          Assessment:  Anticoagulation - Enoxaparin 40 mg sq daily  Infectious Disease - RLE osteomyelitis: D10 vancomycin / D11 Zosyn.  POD#6 graft procedure by [facility] with concomitant removal of infected hardware and necrotic tissue by Dr. Lajoyce Corners. Afeb. UCx negative. BCx NGTDx 2;   Neurology - PRN pain meds  Cardiovascular -BP up to 161/73. Lisinopril 5mg /d  Endocrinology - DM2: CBG 91-180. Lantus, SSI.   Gastrointestinal / Nutrition -Pepcid, acidophilus  Nephrology - Scr, UOP stable. Lytes WNL  Hematology / Oncology - CBC stable  PTA Medication Issues - Not on home aspirin or simvastatin s/p sx 12/21, f/u restart  Plan: Vancomycin 1500mg  /24h. Level due if not discharged. D/c home on Doxycycline   Merilynn Finland, [employer] 10/03/2011,1:13 PM

## 2011-10-03 NOTE — Progress Notes (Signed)
Patient discharging with a wound vac. Faxed orders to [facility], awaiting authorization. Faxed orders to [facility] for home health RN to follow. There [facility] office will open the case on Monday 10/07/11, there [place] office will assume the case on Wednesday, October 09, 2011. Spoke with Mrs. Cavins and explained this and the reason they have to wait for the vac.

## 2011-10-03 NOTE — Progress Notes (Signed)
[facility] Progress Note  10/03/2011 7:27 AM POD 6  "I feel okay, but would be doing better if you tell me I can go home today!"  Filed Vitals:   10/03/11 0427  BP: 161/73  Pulse: 94  Temp: 97.1 F (36.2 C)  Resp: 18    Incisions:  Right groin with some erythema and minimal drainage. All other incisions are c/d/i. Extremities:  Right foot with VAC in place and areas of eschar.  Right foot warm and well perfused.  CBC    Component Value Date/Time   WBC 16.1* 09/28/2011 0042   RBC 3.46* 09/28/2011 0042   HGB 9.5* 09/28/2011 0042   HCT 30.6* 09/28/2011 0042   PLT 411* 09/28/2011 0042   MCV 88.4 09/28/2011 0042   MCH 27.5 09/28/2011 0042   MCHC 31.0 09/28/2011 0042   RDW 13.3 09/28/2011 0042    BMET    Component Value Date/Time   NA 136 09/28/2011 0042   K 3.8 09/28/2011 0042   CL 101 09/28/2011 0042   CO2 25 09/28/2011 0042   GLUCOSE 200* 09/28/2011 0042   BUN 8 09/28/2011 0042   CREATININE 0.98 09/28/2011 0042   CALCIUM 8.6 09/28/2011 0042   GFRNONAA 79* 09/28/2011 0042   GFRAA >90 09/28/2011 0042    INR No results found for this basename: inr     Intake/Output Summary (Last 24 hours) at 10/03/11 0727 Last data filed at 10/03/11 0400  Gross per 24 hour  Intake    840 ml  Output   1025 ml  Net   -185 ml     Assessment/Plan:  76 y.o. male is s/p right femoral to tibial artery bypass graft POD 6 -pt wants to go home. -f/u with Dr. Lajoyce Corners per Dr. Lajoyce Corners -f/u with Dr. Myra Gianotti in 2 weeks. -home PT/OT  Newton Pigg, PA-C [facility] [phone] 10/03/2011 7:27 AM

## 2011-10-04 LAB — CULTURE, BLOOD (ROUTINE X 2)
Culture  Setup Time: 201212221853
Culture: NO GROWTH
Culture: NO GROWTH

## 2011-10-10 NOTE — Discharge Summary (Signed)
Agree with above  [name] 

## 2011-10-15 ENCOUNTER — Encounter (HOSPITAL_COMMUNITY): Payer: Self-pay

## 2011-10-17 ENCOUNTER — Encounter (HOSPITAL_COMMUNITY): Payer: Self-pay | Admitting: Surgery

## 2011-10-17 ENCOUNTER — Other Ambulatory Visit (HOSPITAL_COMMUNITY): Payer: Self-pay | Admitting: Orthopedic Surgery

## 2011-10-17 NOTE — Progress Notes (Signed)
No orders noted.  Dr. Audrie Lia nurse, Autumn notified.  She stated she would notify him.//L. Kellin Fifer,RN

## 2011-10-18 ENCOUNTER — Other Ambulatory Visit: Payer: Self-pay | Admitting: Orthopedic Surgery

## 2011-10-18 ENCOUNTER — Inpatient Hospital Stay (HOSPITAL_COMMUNITY)
Admission: RE | Admit: 2011-10-18 | Discharge: 2011-10-20 | DRG: 617 | Disposition: A | Discharge status: Home Health | Payer: Medicare Other | Source: Ambulatory Visit | Attending: Orthopedic Surgery | Admitting: Orthopedic Surgery

## 2011-10-18 ENCOUNTER — Encounter: Payer: Self-pay | Admitting: Surgery

## 2011-10-18 ENCOUNTER — Encounter (HOSPITAL_COMMUNITY): Payer: Self-pay | Admitting: Anesthesiology

## 2011-10-18 ENCOUNTER — Encounter (HOSPITAL_COMMUNITY): Payer: Self-pay | Admitting: Surgery

## 2011-10-18 ENCOUNTER — Encounter (HOSPITAL_COMMUNITY)
Admission: RE | Disposition: A | Discharge status: Home Health | Payer: Self-pay | Source: Ambulatory Visit | Attending: Orthopedic Surgery

## 2011-10-18 ENCOUNTER — Inpatient Hospital Stay (HOSPITAL_COMMUNITY): Payer: Medicare Other | Admitting: Anesthesiology

## 2011-10-18 DIAGNOSIS — E1159 Type 2 diabetes mellitus with other circulatory complications: Secondary | ICD-10-CM | POA: Diagnosis present

## 2011-10-18 DIAGNOSIS — Z01818 Encounter for other preprocedural examination: Secondary | ICD-10-CM

## 2011-10-18 DIAGNOSIS — I798 Other disorders of arteries, arterioles and capillaries in diseases classified elsewhere: Secondary | ICD-10-CM | POA: Diagnosis present

## 2011-10-18 DIAGNOSIS — M869 Osteomyelitis, unspecified: Secondary | ICD-10-CM | POA: Diagnosis present

## 2011-10-18 DIAGNOSIS — E1169 Type 2 diabetes mellitus with other specified complication: Principal | ICD-10-CM | POA: Diagnosis present

## 2011-10-18 DIAGNOSIS — I739 Peripheral vascular disease, unspecified: Secondary | ICD-10-CM

## 2011-10-18 DIAGNOSIS — M9080 Osteopathy in diseases classified elsewhere, unspecified site: Secondary | ICD-10-CM | POA: Diagnosis present

## 2011-10-18 DIAGNOSIS — I96 Gangrene, not elsewhere classified: Secondary | ICD-10-CM | POA: Diagnosis present

## 2011-10-18 DIAGNOSIS — Z7982 Long term (current) use of aspirin: Secondary | ICD-10-CM

## 2011-10-18 DIAGNOSIS — I10 Essential (primary) hypertension: Secondary | ICD-10-CM | POA: Diagnosis present

## 2011-10-18 DIAGNOSIS — Z794 Long term (current) use of insulin: Secondary | ICD-10-CM

## 2011-10-18 HISTORY — PX: AMPUTATION: SHX166

## 2011-10-18 LAB — CBC
HCT: 37.4 % — ABNORMAL LOW (ref 39.0–52.0)
MCHC: 31.3 g/dL (ref 30.0–36.0)
MCV: 86.8 fL (ref 78.0–100.0)
RDW: 14.2 % (ref 11.5–15.5)

## 2011-10-18 LAB — SURGICAL PCR SCREEN: Staphylococcus aureus: NEGATIVE

## 2011-10-18 LAB — PROTIME-INR: Prothrombin Time: 14.7 seconds (ref 11.6–15.2)

## 2011-10-18 LAB — BASIC METABOLIC PANEL
BUN: 14 mg/dL (ref 6–23)
Chloride: 103 mEq/L (ref 96–112)
Creatinine, Ser: 0.96 mg/dL (ref 0.50–1.35)
GFR calc Af Amer: >90 mL/min (ref >90)

## 2011-10-18 SURGERY — AMPUTATION BELOW KNEE
Anesthesia: General | Site: Leg Lower | Laterality: Right | Wound class: Dirty or Infected

## 2011-10-18 MED ORDER — DEXTROSE 50 % IV SOLN
INTRAVENOUS | Status: DC | PRN
  Administered 2011-10-18: 12.5 g via INTRAVENOUS

## 2011-10-18 MED ORDER — RISAQUAD PO CAPS
1.0000 | ORAL_CAPSULE | Freq: Three times a day (TID) | ORAL | Status: DC
  Filled 2011-10-18 (×3): qty 1

## 2011-10-18 MED ORDER — LACTINEX PO CHEW
1.0000 | CHEWABLE_TABLET | Freq: Three times a day (TID) | ORAL | Status: DC
  Administered 2011-10-19 – 2011-10-20 (×3): 1 via ORAL
  Filled 2011-10-18 (×9): qty 1

## 2011-10-18 MED ORDER — INSULIN ASPART 100 UNIT/ML ~~LOC~~ SOLN
3.0000 [IU] | Freq: Three times a day (TID) | SUBCUTANEOUS | Status: DC
  Administered 2011-10-20: 3 [IU] via SUBCUTANEOUS
  Filled 2011-10-18: qty 3

## 2011-10-18 MED ORDER — CEFAZOLIN SODIUM 1-5 GM-% IV SOLN
INTRAVENOUS | Status: DC | PRN
  Administered 2011-10-18: 1 g via INTRAVENOUS

## 2011-10-18 MED ORDER — LACTATED RINGERS IV SOLN
INTRAVENOUS | Status: DC | PRN
  Administered 2011-10-18 (×2): via INTRAVENOUS

## 2011-10-18 MED ORDER — SODIUM CHLORIDE 0.9 % IV SOLN
INTRAVENOUS | Status: DC
  Administered 2011-10-18: 18:00:00 via INTRAVENOUS

## 2011-10-18 MED ORDER — HYDROMORPHONE HCL PF 1 MG/ML IJ SOLN
INTRAMUSCULAR | Status: AC
  Filled 2011-10-18: qty 1

## 2011-10-18 MED ORDER — OMEGA-3-ACID ETHYL ESTERS 1 G PO CAPS
1.0000 g | ORAL_CAPSULE | Freq: Two times a day (BID) | ORAL | Status: DC
  Administered 2011-10-18 – 2011-10-20 (×4): 1 g via ORAL
  Filled 2011-10-18 (×5): qty 1

## 2011-10-18 MED ORDER — DOCUSATE SODIUM 100 MG PO CAPS
100.0000 mg | ORAL_CAPSULE | Freq: Two times a day (BID) | ORAL | Status: DC
  Administered 2011-10-18 – 2011-10-20 (×4): 100 mg via ORAL
  Filled 2011-10-18 (×5): qty 1

## 2011-10-18 MED ORDER — HYDROMORPHONE HCL PF 1 MG/ML IJ SOLN
0.5000 mg | INTRAMUSCULAR | Status: DC | PRN
  Administered 2011-10-18: 0.5 mg via INTRAVENOUS
  Administered 2011-10-18 – 2011-10-20 (×5): 1 mg via INTRAVENOUS
  Filled 2011-10-18 (×6): qty 1

## 2011-10-18 MED ORDER — ASPIRIN EC 81 MG PO TBEC
81.0000 mg | DELAYED_RELEASE_TABLET | Freq: Every evening | ORAL | Status: DC
  Administered 2011-10-19: 81 mg via ORAL
  Filled 2011-10-18 (×4): qty 1

## 2011-10-18 MED ORDER — OXYCODONE-ACETAMINOPHEN 5-325 MG PO TABS
1.0000 | ORAL_TABLET | ORAL | Status: DC | PRN
  Administered 2011-10-18 – 2011-10-20 (×6): 2 via ORAL
  Filled 2011-10-18 (×6): qty 2

## 2011-10-18 MED ORDER — MUPIROCIN 2 % EX OINT
TOPICAL_OINTMENT | CUTANEOUS | Status: AC
  Administered 2011-10-18: 1 via NASAL
  Filled 2011-10-18: qty 22

## 2011-10-18 MED ORDER — METHOCARBAMOL 100 MG/ML IJ SOLN
500.0000 mg | Freq: Four times a day (QID) | INTRAVENOUS | Status: DC | PRN
  Filled 2011-10-18: qty 5

## 2011-10-18 MED ORDER — FERROUS SULFATE 325 (65 FE) MG PO TABS
325.0000 mg | ORAL_TABLET | Freq: Three times a day (TID) | ORAL | Status: DC
  Administered 2011-10-19 – 2011-10-20 (×4): 325 mg via ORAL
  Filled 2011-10-18 (×9): qty 1

## 2011-10-18 MED ORDER — INSULIN ASPART 100 UNIT/ML ~~LOC~~ SOLN
0.0000 [IU] | Freq: Three times a day (TID) | SUBCUTANEOUS | Status: DC
  Filled 2011-10-18: qty 3

## 2011-10-18 MED ORDER — HYDROMORPHONE HCL PF 1 MG/ML IJ SOLN
0.2500 mg | INTRAMUSCULAR | Status: DC | PRN
  Administered 2011-10-18 (×2): 0.25 mg via INTRAVENOUS
  Administered 2011-10-18: 0.5 mg via INTRAVENOUS
  Administered 2011-10-18 (×2): 0.25 mg via INTRAVENOUS

## 2011-10-18 MED ORDER — METFORMIN HCL 500 MG PO TABS
1000.0000 mg | ORAL_TABLET | Freq: Two times a day (BID) | ORAL | Status: DC
  Administered 2011-10-18 – 2011-10-20 (×4): 1000 mg via ORAL
  Filled 2011-10-18 (×6): qty 2

## 2011-10-18 MED ORDER — METOCLOPRAMIDE HCL 5 MG/ML IJ SOLN
5.0000 mg | Freq: Three times a day (TID) | INTRAMUSCULAR | Status: DC | PRN
  Filled 2011-10-18: qty 2

## 2011-10-18 MED ORDER — INSULIN GLARGINE 100 UNIT/ML ~~LOC~~ SOLN
100.0000 [IU] | Freq: Every day | SUBCUTANEOUS | Status: DC
  Filled 2011-10-18: qty 3

## 2011-10-18 MED ORDER — FENTANYL CITRATE 0.05 MG/ML IJ SOLN
INTRAMUSCULAR | Status: DC | PRN
  Administered 2011-10-18 (×5): 50 ug via INTRAVENOUS

## 2011-10-18 MED ORDER — ONDANSETRON HCL 4 MG/2ML IJ SOLN
INTRAMUSCULAR | Status: DC | PRN
  Administered 2011-10-18: 4 mg via INTRAVENOUS

## 2011-10-18 MED ORDER — PROMETHAZINE HCL 25 MG/ML IJ SOLN: 6.2500 mg | INTRAMUSCULAR | Status: DC | PRN

## 2011-10-18 MED ORDER — SIMVASTATIN 40 MG PO TABS
40.0000 mg | ORAL_TABLET | Freq: Every day | ORAL | Status: DC
  Administered 2011-10-18 – 2011-10-19 (×2): 40 mg via ORAL
  Filled 2011-10-18 (×3): qty 1

## 2011-10-18 MED ORDER — METOCLOPRAMIDE HCL 10 MG PO TABS: 5.0000 mg | ORAL_TABLET | Freq: Three times a day (TID) | ORAL | Status: DC | PRN

## 2011-10-18 MED ORDER — ONDANSETRON HCL 4 MG PO TABS: 4.0000 mg | ORAL_TABLET | Freq: Four times a day (QID) | ORAL | Status: DC | PRN

## 2011-10-18 MED ORDER — WARFARIN SODIUM 7.5 MG PO TABS
7.5000 mg | ORAL_TABLET | Freq: Once | ORAL | Status: AC
  Administered 2011-10-18: 7.5 mg via ORAL
  Filled 2011-10-18: qty 1

## 2011-10-18 MED ORDER — CEFAZOLIN SODIUM 1-5 GM-% IV SOLN
INTRAVENOUS | Status: AC
  Filled 2011-10-18: qty 50

## 2011-10-18 MED ORDER — CEFAZOLIN SODIUM 1-5 GM-% IV SOLN
1.0000 g | Freq: Four times a day (QID) | INTRAVENOUS | Status: AC
  Administered 2011-10-18 – 2011-10-19 (×2): 1 g via INTRAVENOUS
  Filled 2011-10-18 (×4): qty 50

## 2011-10-18 MED ORDER — HYDROCODONE-ACETAMINOPHEN 5-325 MG PO TABS
1.0000 | ORAL_TABLET | ORAL | Status: DC | PRN
  Administered 2011-10-18 – 2011-10-19 (×2): 2 via ORAL
  Filled 2011-10-18 (×3): qty 2

## 2011-10-18 MED ORDER — 0.9 % SODIUM CHLORIDE (POUR BTL) OPTIME
TOPICAL | Status: DC | PRN
  Administered 2011-10-18: 1000 mL

## 2011-10-18 MED ORDER — COUMADIN BOOK
Freq: Once | Status: AC
  Administered 2011-10-18: 23:00:00
  Filled 2011-10-18: qty 1

## 2011-10-18 MED ORDER — WARFARIN VIDEO
Freq: Once | Status: AC
  Administered 2011-10-19: 17:00:00

## 2011-10-18 MED ORDER — METFORMIN HCL 500 MG PO TABS: 1000.0000 mg | ORAL_TABLET | Freq: Two times a day (BID) | ORAL | Status: DC

## 2011-10-18 MED ORDER — INSULIN GLARGINE 100 UNIT/ML ~~LOC~~ SOLN
40.0000 [IU] | Freq: Every day | SUBCUTANEOUS | Status: DC
  Administered 2011-10-19: 40 [IU] via SUBCUTANEOUS
  Filled 2011-10-18: qty 3

## 2011-10-18 MED ORDER — DIPHENHYDRAMINE HCL 12.5 MG/5ML PO ELIX
12.5000 mg | ORAL_SOLUTION | ORAL | Status: DC | PRN
  Filled 2011-10-18: qty 10

## 2011-10-18 MED ORDER — PROPOFOL 10 MG/ML IV EMUL
INTRAVENOUS | Status: DC | PRN
  Administered 2011-10-18: 120 mg via INTRAVENOUS

## 2011-10-18 MED ORDER — LACTATED RINGERS IV SOLN: INTRAVENOUS | Status: DC

## 2011-10-18 MED ORDER — GABAPENTIN 300 MG PO CAPS
300.0000 mg | ORAL_CAPSULE | Freq: Three times a day (TID) | ORAL | Status: DC
  Administered 2011-10-18 – 2011-10-20 (×6): 300 mg via ORAL
  Filled 2011-10-18 (×9): qty 1

## 2011-10-18 MED ORDER — METHOCARBAMOL 500 MG PO TABS
500.0000 mg | ORAL_TABLET | Freq: Four times a day (QID) | ORAL | Status: DC | PRN
  Administered 2011-10-18 – 2011-10-19 (×4): 500 mg via ORAL
  Filled 2011-10-18 (×4): qty 1

## 2011-10-18 MED ORDER — ONDANSETRON HCL 4 MG/2ML IJ SOLN: 4.0000 mg | Freq: Four times a day (QID) | INTRAMUSCULAR | Status: DC | PRN

## 2011-10-18 SURGICAL SUPPLY — 46 items
BANDAGE ESMARK 6X9 LF (GAUZE/BANDAGES/DRESSINGS) ×1 IMPLANT
BANDAGE GAUZE ELAST BULKY 4 IN (GAUZE/BANDAGES/DRESSINGS) ×2 IMPLANT
BLADE SAW RECIP 87.9 MT (BLADE) ×2 IMPLANT
BLADE SURG 21 STRL SS (BLADE) ×2 IMPLANT
BNDG COHESIVE 6X5 TAN STRL LF (GAUZE/BANDAGES/DRESSINGS) ×2 IMPLANT
BNDG ESMARK 6X9 LF (GAUZE/BANDAGES/DRESSINGS) ×2
CLOTH BEACON ORANGE TIMEOUT ST (SAFETY) ×2 IMPLANT
COVER SURGICAL LIGHT HANDLE (MISCELLANEOUS) ×2 IMPLANT
CUFF TOURNIQUET SINGLE 34IN LL (TOURNIQUET CUFF) ×2 IMPLANT
CUFF TOURNIQUET SINGLE 44IN (TOURNIQUET CUFF) IMPLANT
DRAIN PENROSE 1/2X12 LTX STRL (WOUND CARE) ×2 IMPLANT
DRAPE EXTREMITY T 121X128X90 (DRAPE) ×2 IMPLANT
DRAPE PROXIMA HALF (DRAPES) ×4 IMPLANT
DRAPE U-SHAPE 47X51 STRL (DRAPES) ×4 IMPLANT
DRSG ADAPTIC 3X8 NADH LF (GAUZE/BANDAGES/DRESSINGS) ×2 IMPLANT
DRSG PAD ABDOMINAL 8X10 ST (GAUZE/BANDAGES/DRESSINGS) ×2 IMPLANT
DURAPREP 26ML APPLICATOR (WOUND CARE) ×2 IMPLANT
ELECT REM PT RETURN 9FT ADLT (ELECTROSURGICAL) ×2
ELECTRODE REM PT RTRN 9FT ADLT (ELECTROSURGICAL) ×1 IMPLANT
EVACUATOR 1/8 PVC DRAIN (DRAIN) IMPLANT
GAUZE SPONGE 4X4 12PLY STRL LF (GAUZE/BANDAGES/DRESSINGS) ×2 IMPLANT
GLOVE BIOGEL PI IND STRL 9 (GLOVE) ×1 IMPLANT
GLOVE BIOGEL PI INDICATOR 9 (GLOVE) ×1
GLOVE SS N UNI LF 7.5 STRL (GLOVE) ×6 IMPLANT
GLOVE SURG ORTHO 9.0 STRL STRW (GLOVE) ×2 IMPLANT
GOWN PREVENTION PLUS XLARGE (GOWN DISPOSABLE) ×2 IMPLANT
GOWN SRG XL XLNG 56XLVL 4 (GOWN DISPOSABLE) ×1 IMPLANT
GOWN STRL NON-REIN XL XLG LVL4 (GOWN DISPOSABLE) ×1
KIT BASIN OR (CUSTOM PROCEDURE TRAY) ×2 IMPLANT
KIT ROOM TURNOVER OR (KITS) ×2 IMPLANT
MANIFOLD NEPTUNE II (INSTRUMENTS) ×2 IMPLANT
NS IRRIG 1000ML POUR BTL (IV SOLUTION) ×2 IMPLANT
PACK GENERAL/GYN (CUSTOM PROCEDURE TRAY) ×2 IMPLANT
PAD ARMBOARD 7.5X6 YLW CONV (MISCELLANEOUS) ×4 IMPLANT
SPONGE GAUZE 4X4 12PLY (GAUZE/BANDAGES/DRESSINGS) ×2 IMPLANT
SPONGE LAP 18X18 X RAY DECT (DISPOSABLE) ×2 IMPLANT
STAPLER VISISTAT 35W (STAPLE) ×2 IMPLANT
STOCKINETTE IMPERVIOUS LG (DRAPES) ×2 IMPLANT
SUT PDS AB 1 CT  36 (SUTURE) ×1
SUT PDS AB 1 CT 36 (SUTURE) ×1 IMPLANT
SUT SILK 2 0 (SUTURE) ×1
SUT SILK 2-0 18XBRD TIE 12 (SUTURE) ×1 IMPLANT
TOWEL OR 17X24 6PK STRL BLUE (TOWEL DISPOSABLE) ×2 IMPLANT
TOWEL OR 17X26 10 PK STRL BLUE (TOWEL DISPOSABLE) ×2 IMPLANT
TUBE ANAEROBIC SPECIMEN COL (MISCELLANEOUS) IMPLANT
WATER STERILE IRR 1000ML POUR (IV SOLUTION) IMPLANT

## 2011-10-18 NOTE — Progress Notes (Signed)
ANTICOAGULATION CONSULT NOTE - Initial Consult  Pharmacy Consult for Coumadin  Indication: VTE prophylaxis  No Known Allergies  Patient Measurements: Height: 5\' 10"  (177.8 cm) Weight: 195 lb (88.451 kg) IBW/kg (Calculated) : 73    Vital Signs: Temp: 98.8 F (37.1 C) (01/11 1615) Temp src: Oral (01/11 0720) BP: 126/64 mmHg (01/11 1609) Pulse Rate: 98  (01/11 1615)  Labs:  Basename 10/18/11 0715  HGB 11.7*  HCT 37.4*  PLT 315  APTT --  LABPROT --  INR --  HEPARINUNFRC --  CREATININE 0.96  CKTOTAL --  CKMB --  TROPONINI --   Estimated Creatinine Clearance: 75.6 ml/min (by C-G formula based on Cr of 0.96).  Medical History: Past Medical History  Diagnosis Date  . Diabetes mellitus   . Hypercholesteremia     Medications:  Prescriptions prior to admission  Medication Sig Dispense Refill  . acidophilus (RISAQUAD) CAPS Take 1 capsule by mouth 3 (three) times daily.        [name] aspirin EC 81 MG tablet Take 81 mg by mouth every evening.        [name] doxycycline (VIBRA-TABS) 100 MG tablet Take 100 mg by mouth 2 (two) times daily. Pt started on 12/28 for 15 days       . gabapentin (NEURONTIN) 100 MG capsule Take 300 mg by mouth 3 (three) times daily.        . insulin aspart protamine-insulin aspart (NOVOLOG 70/30) (70-30) 100 UNIT/ML injection Inject 25 Units into the skin See admin instructions. Takes 25 units twice daily to three times daily if eats a large meal with lunch and/or Supper      . insulin glargine (LANTUS) 100 UNIT/ML injection Inject 40 Units into the skin at bedtime.        . metFORMIN (GLUCOPHAGE) 1000 MG tablet Take 1,000 mg by mouth 2 (two) times daily.        . Multiple Vitamins-Minerals (MULTIVITAMINS THER. W/MINERALS) TABS Take 1 tablet by mouth daily.        [name] omega-3 acid ethyl esters (LOVAZA) 1 G capsule Take 1 g by mouth 2 (two) times daily.        . simvastatin (ZOCOR) 40 MG tablet Take 40 mg by mouth at bedtime.        . Zinc 50 MG CAPS Take 1  capsule by mouth daily.         Scheduled:    . acidophilus  1 capsule Oral TID  . aspirin EC  81 mg Oral QPM  . ceFAZolin (ANCEF) IV  1 g Intravenous Q6H  . docusate sodium  100 mg Oral BID  . ferrous sulfate  325 mg Oral TID PC  . gabapentin  300 mg Oral TID  . HYDROmorphone      . insulin aspart  0-15 Units Subcutaneous TID WC  . insulin aspart  3 Units Subcutaneous TID WC  . insulin glargine  40 Units Subcutaneous QHS  . metFORMIN  1,000 mg Oral BID WC  . mupirocin ointment      . omega-3 acid ethyl esters  1 g Oral BID  . simvastatin  40 mg Oral QHS  . DISCONTD: insulin glargine  100 Units Subcutaneous QHS  . DISCONTD: metFORMIN  1,000 mg Oral BID    Assessment: 77 y.o. 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 This patient has history of DM and HLD.   Goal of Therapy:  INR 2-3  Plan:  No baseline PT/INR available. Will order INR now and order Coumadin dose after evaluating INR baseline result.  Will monitor daily INR.   Arman Filter 10/18/2011,5:49 PM

## 2011-10-18 NOTE — Op Note (Signed)
OPERATIVE REPORT  DATE OF SURGERY: 10/18/2011  PATIENT:  [name],  77 y.o. male  PRE-OPERATIVE DIAGNOSIS:  Gangrene Right Foot  POST-OPERATIVE DIAGNOSIS:  Gangrene Right Foot  PROCEDURE:  Procedure(s): AMPUTATION BELOW KNEE  SURGEON:  Surgeon(s): Nadara Mustard, MD  ANESTHESIA:   general  EBL:  min ML  SPECIMEN:  Source of Specimen:  Right leg sent to pathology  TOURNIQUET:   Total Tourniquet Time Documented: Thigh (Right) - 5 minutes  PROCEDURE DETAILS: Patient is a 77 year old gentleman with severe peripheral vascular disease he is status post revascularization to the right lower extremity as well as foot salvage surgery. Patient has failed right lower extremity foot salvage surgery with progressive necrosis gangrene and osteomyelitis and presents at this time for a transtibial amputation risks and benefits were discussed patient states he understands and wishes to proceed at this time. A strip of procedure patient was brought to OR room tendon underwent a general anesthetic. After adequate levels of anesthesia were obtained patient's right lower extremity was prepped using DuraPrep and draped into a sterile field the foot was draped out of the sterile field into an impervious stockinette. An incision was made 10 cm distal to the tibial tubercle was curved proximally and a large posterior flap was created the tibia was transected and beveled just proximal to the skin incision and the fibula was transected just proximal tibial incision. A large posterior flap was created the sciatic nerve was pulled cut and allowed to retract the vascular bundles were suture ligated x2 each with 2-0 silk. The tourniquet was deflated after 5 minutes hemostasis was obtained the deep and superficial fascial layers were closed using #1 PDS the skin was closed using approximate staples and 2-0 nylon the wound was covered with Adaptic orthopedic sponges Kerlix and Coban. Patient was extubated taken to the  PACU in stable condition.  PLAN OF CARE: Admit to inpatient   PATIENT DISPOSITION:  PACU - hemodynamically stable.   Nadara Mustard, MD 10/18/2011 11:19 AM

## 2011-10-18 NOTE — H&P (Signed)
[name] is an 77 y.o. male.   Chief Complaint: Gangrene right foot HPI: Patient is status post limb salvage surgery with revascularization and revision internal fixation of the right lower extremity and has had progressive gangrenous necrotic changes despite limb salvage attempts.  Past Medical History  Diagnosis Date  . Diabetes mellitus   . Hypercholesteremia     Past Surgical History  Procedure Date  . Spine surgery   . Fracture surgery 08-2011    ORIF right ankle  . Femoral-tibial bypass graft 09/27/2011    Procedure: BYPASS GRAFT FEMORAL-TIBIAL ARTERY;  Surgeon: Juleen China, MD;  Location: [facility] OR;  Service: Vascular;  Laterality: Right;  Procedure started @1607           Femoral/Anterior Tibial Bypass right .  Right Femoral Endarterectomy.  . Tibia im nail insertion 09/27/2011    Procedure: INTRAMEDULLARY (IM) NAIL TIBIAL;  Surgeon: Nadara Mustard, MD;  Location: [facility] OR;  Service: Orthopedics;  Laterality: Right;  Procedure started @1510 -1536 in addition: Removal of Hardware Right Fibula  . Back surgery 1990's    Family History  Problem Relation Age of Onset  . Diabetes Other    Social History:  reports that he has never smoked. He does not have any smokeless tobacco history on file. He reports that he does not drink alcohol or use illicit drugs.  Allergies: No Known Allergies  No current facility-administered medications on file as of 10/18/2011.   Medications Prior to Admission  Medication Sig Dispense Refill  . acidophilus (RISAQUAD) CAPS Take 1 capsule by mouth 3 (three) times daily.        [name] aspirin EC 81 MG tablet Take 81 mg by mouth every evening.        . insulin aspart protamine-insulin aspart (NOVOLOG 70/30) (70-30) 100 UNIT/ML injection Inject 25 Units into the skin See admin instructions. Takes 25 units twice daily to three times daily if eats a large meal with lunch and/or Supper      . insulin glargine (LANTUS) 100 UNIT/ML injection Inject 40 Units into  the skin at bedtime.        . metFORMIN (GLUCOPHAGE) 1000 MG tablet Take 1,000 mg by mouth 2 (two) times daily.        [name] omega-3 acid ethyl esters (LOVAZA) 1 G capsule Take 1 g by mouth 2 (two) times daily.        . simvastatin (ZOCOR) 40 MG tablet Take 40 mg by mouth at bedtime.          No results found for this or any previous visit (from the past 48 hour(s)). No results found.  Review of Systems  All other systems reviewed and are negative.    Height 5\' 10"  (1.778 m), weight 88.451 kg (195 lb). Physical Exam  On examination patient has exposed necrotic bone over the medial and lateral malleolus as well as entire dorsally over the ankle. He does not have palpable pulses. Assessment/Plan Gangrene and osteomyelitis right lower extremity. Plan plan for right transtibial amputation risks and benefits were discussed including infection neurovascular injury nonhealing wound need for higher level amputation patient states he understands and wished to proceed at this time.  [name] V 10/18/2011, 6:04 AM

## 2011-10-18 NOTE — Progress Notes (Addendum)
Abnormal EKG noted-Right Bundle Branch Block. Pt denies any sx.  Anesthesiologist, Dr. Jacklynn Bue, notified and no orders received.  Pt's cbg-66.  Pt denies sx.  Dr. Jacklynn Bue notified and no orders received.  Will continue to asses every hour.  Pt instructed to notify RN if he becomes symptomatic.  Pt verbalized understanding.//L. Itza Maniaci,RN

## 2011-10-18 NOTE — Transfer of Care (Signed)
Immediate Anesthesia Transfer of Care Note  Patient: [name]  Procedure(s) Performed:  AMPUTATION BELOW KNEE - Right Below Knee Amputation  Patient Location: PACU  Anesthesia Type: General  Level of Consciousness: awake, alert  and oriented  Airway & Oxygen Therapy: Patient Spontanous Breathing and Patient connected to nasal cannula oxygen  Post-op Assessment: Report given to PACU RN  Post vital signs: Reviewed and stable Filed Vitals:   10/18/11 0720  BP: 145/73  Pulse: 97  Temp: 36.4 C  Resp: 18    Complications: No apparent anesthesia complications

## 2011-10-18 NOTE — Progress Notes (Signed)
ANTICOAGULATION CONSULT NOTE - Follow Up Consult  Pharmacy Consult for Coumadin (Follow up of baseline INR) Indication: VTE prophylaxis  No Known Allergies  Patient Measurements: Height: 5\' 10"  (177.8 cm) Weight: 195 lb (88.451 kg) IBW/kg (Calculated) : 73   Vital Signs: Temp: 98.8 F (37.1 C) (01/11 1615) BP: 126/64 mmHg (01/11 1609) Pulse Rate: 98  (01/11 1615)  Labs:  Basename 10/18/11 1835 10/18/11 0715  HGB -- 11.7*  HCT -- 37.4*  PLT -- 315  APTT -- --  LABPROT 14.7 --  INR 1.13 --  HEPARINUNFRC -- --  CREATININE -- 0.96  CKTOTAL -- --  CKMB -- --  TROPONINI -- --   Estimated Creatinine Clearance: 75.6 ml/min (by C-G formula based on Cr of 0.96).   Assessment: Follow up of Baseline INR is 1.13 today. Initiating Coumadin today for VTE prophylaxis s/p R. BKA today 10/18/11.  Goal of Therapy:  INR 2-3   Plan: Coumadin 7.5mg  po tonight x1. Monitor daily INR.   Arman Filter 10/18/2011,7:49 PM

## 2011-10-18 NOTE — Progress Notes (Signed)
CBG-64@835 .  Pt denies sx. Transporter at bedside. Note placed on chart for OR.//L. Gershon Shorten,RN

## 2011-10-18 NOTE — Anesthesia Preprocedure Evaluation (Signed)
Anesthesia Evaluation  Patient identified by MRN, date of birth, ID band Patient awake    Reviewed: Allergy & Precautions, H&P , NPO status , Patient's Chart, lab work & pertinent test results  Airway Mallampati: II  Neck ROM: Full    Dental  (+) Edentulous Upper and Edentulous Lower   Pulmonary neg pulmonary ROS,  clear to auscultation        Cardiovascular hypertension, Regular Normal    Neuro/Psych Negative Neurological ROS     GI/Hepatic negative GI ROS, Neg liver ROS,   Endo/Other  Diabetes mellitus-  Renal/GU negative Renal ROS     Musculoskeletal   Abdominal   Peds  Hematology   Anesthesia Other Findings   Reproductive/Obstetrics                           Anesthesia Physical Anesthesia Plan  ASA: II  Anesthesia Plan: General   Post-op Pain Management:    Induction: Intravenous  Airway Management Planned: LMA  Additional Equipment:   Intra-op Plan:   Post-operative Plan: Extubation in OR  Informed Consent: I have reviewed the patients History and Physical, chart, labs and discussed the procedure including the risks, benefits and alternatives for the proposed anesthesia with the patient or authorized representative who has indicated his/her understanding and acceptance.     Plan Discussed with: CRNA and Surgeon  Anesthesia Plan Comments:         Anesthesia Quick Evaluation

## 2011-10-18 NOTE — Preoperative (Signed)
Beta Blockers   Reason not to administer Beta Blockers:Not Applicable 

## 2011-10-18 NOTE — Anesthesia Postprocedure Evaluation (Signed)
  Anesthesia Post-op Note  Patient: [name]  Procedure(s) Performed:  AMPUTATION BELOW KNEE - Right Below Knee Amputation  Patient Location: PACU  Anesthesia Type: General  Level of Consciousness: awake  Airway and Oxygen Therapy: Patient Spontanous Breathing  Post-op Pain: moderate  Post-op Assessment: Post-op Vital signs reviewed  Post-op Vital Signs: stable  Complications: No apparent anesthesia complications

## 2011-10-18 NOTE — Progress Notes (Signed)
Patient moved to regular bed, tw.

## 2011-10-19 LAB — BASIC METABOLIC PANEL
Calcium: 8.9 mg/dL (ref 8.4–10.5)
Chloride: 101 mEq/L (ref 96–112)
Creatinine, Ser: 1 mg/dL (ref 0.50–1.35)
GFR calc Af Amer: 83 mL/min — ABNORMAL LOW (ref >90)
Sodium: 138 mEq/L (ref 135–145)

## 2011-10-19 LAB — CBC
Platelets: 271 10*3/uL (ref 150–400)
RDW: 14.4 % (ref 11.5–15.5)
WBC: 12.8 10*3/uL — ABNORMAL HIGH (ref 4.0–10.5)

## 2011-10-19 LAB — PROTIME-INR
INR: 1.1 (ref 0.00–1.49)
Prothrombin Time: 14.4 seconds (ref 11.6–15.2)

## 2011-10-19 LAB — GLUCOSE, CAPILLARY
Glucose-Capillary: 90 mg/dL (ref 70–99)
Glucose-Capillary: 121 mg/dL — ABNORMAL HIGH (ref 70–99)

## 2011-10-19 MED ORDER — SILVER SULFADIAZINE 1 % EX CREA: TOPICAL_CREAM | Freq: Two times a day (BID) | CUTANEOUS | Status: DC

## 2011-10-19 MED ORDER — WARFARIN SODIUM 7.5 MG PO TABS
7.5000 mg | ORAL_TABLET | Freq: Once | ORAL | Status: AC
  Administered 2011-10-19: 7.5 mg via ORAL
  Filled 2011-10-19: qty 1

## 2011-10-19 MED ORDER — SILVER SULFADIAZINE 1 % EX CREA
TOPICAL_CREAM | Freq: Two times a day (BID) | CUTANEOUS | Status: DC
  Administered 2011-10-19 – 2011-10-20 (×2): via TOPICAL
  Filled 2011-10-19: qty 85

## 2011-10-19 NOTE — Progress Notes (Signed)
Subjective: 1 Day Post-Op Procedure(s) (LRB): AMPUTATION BELOW KNEE (Right) No complaints this AM Objective: Vital signs in last 24 hours: Temp:  [97.5 F (36.4 C)-98.8 F (37.1 C)] 98.4 F (36.9 C) (01/12 0500) Pulse Rate:  [90-101] 90  (01/12 0500) Resp:  [10-18] 18  (01/12 0500) BP: (108-170)/(42-79) 115/44 mmHg (01/12 0500) SpO2:  [89 %-100 %] 99 % (01/12 0500)  Intake/Output from previous day: 01/11 0701 - 01/12 0700 In: 1533 [P.O.:240; I.Sharp.:1293] Out: 850 [Urine:800; Blood:50] Intake/Output this shift: Total I/O In: 240 [P.O.:240] Out: 400 [Urine:400]   Basename 10/18/11 0715  HGB 11.7*    Basename 10/18/11 0715  WBC 12.2*  RBC 4.31  HCT 37.4*  PLT 315    Basename 10/18/11 0715  NA 142  K 4.7  CL 103  CO2 28  BUN 14  CREATININE 0.96  GLUCOSE 84  CALCIUM 9.6    Basename 10/18/11 1835  LABPT --  INR 1.13    Neurologically intact  Assessment/Plan: 1 Day Post-Op Procedure(s) (LRB): AMPUTATION BELOW KNEE (Right) Up with therapy  [name] 10/19/2011, 6:16 AM

## 2011-10-19 NOTE — Progress Notes (Signed)
Physical Therapy Evaluation Patient Details Name: [name] MRN: [id number] DOB: [birth year] Today's Date: 10/19/2011  Problem List:  Patient Active Problem List  Diagnoses  . PAD (peripheral artery disease)  . Pre-operative clearance  . HTN (hypertension)    Past Medical History:  Past Medical History  Diagnosis Date  . Diabetes mellitus   . Hypercholesteremia    Past Surgical History:  Past Surgical History  Procedure Date  . Spine surgery   . Fracture surgery 08-2011    ORIF right ankle  . Femoral-tibial bypass graft 09/27/2011    Procedure: BYPASS GRAFT FEMORAL-TIBIAL ARTERY;  Surgeon: Juleen China, MD;  Location: [facility] OR;  Service: Vascular;  Laterality: Right;  Procedure started @1607           Femoral/Anterior Tibial Bypass right .  Right Femoral Endarterectomy.  . Tibia im nail insertion 09/27/2011    Procedure: INTRAMEDULLARY (IM) NAIL TIBIAL;  Surgeon: Nadara Mustard, MD;  Location: [facility] OR;  Service: Orthopedics;  Laterality: Right;  Procedure started @1510 -1536 in addition: Removal of Hardware Right Fibula  . Back surgery 1990's    PT Assessment/Plan/Recommendation PT Assessment Clinical Impression Statement: Pt presents with a medical diagnosis of Right BKA along with the following impairments/deficits. Pt has been NWB for a long period of time, thus will likely progress quickly and be safe to d/c home. PT Recommendation/Assessment: Patient will need skilled PT in the acute care venue PT Problem List: Decreased strength;Decreased activity tolerance;Decreased balance;Decreased knowledge of use of DME;Pain;Decreased mobility;Decreased knowledge of precautions PT Therapy Diagnosis : Acute pain;Difficulty walking PT Plan PT Frequency: Min 4X/week PT Treatment/Interventions: Gait training;Functional mobility training;Therapeutic activities;Balance training;Patient/family education;Stair training;DME instruction;Therapeutic exercise PT Recommendation Follow Up  Recommendations: Home health PT Equipment Recommended: None recommended by PT PT Goals  Acute Rehab PT Goals PT Goal Formulation: With patient Time For Goal Achievement: 7 days Pt will go Supine/Side to Sit: with modified independence PT Goal: Supine/Side to Sit - Progress: Progressing toward goal Pt will go Sit to Supine/Side: with modified independence PT Goal: Sit to Supine/Side - Progress: Progressing toward goal Pt will go Sit to Stand: with modified independence PT Goal: Sit to Stand - Progress: Progressing toward goal Pt will go Stand to Sit: with modified independence PT Goal: Stand to Sit - Progress: Progressing toward goal Pt will Transfer Bed to Chair/Chair to Bed: with supervision PT Transfer Goal: Bed to Chair/Chair to Bed - Progress: Progressing toward goal Pt will Ambulate: 16 - 50 feet;with supervision;with rolling walker PT Goal: Ambulate - Progress: Progressing toward goal Pt will Go Up / Down Stairs: 6-9 stairs;with rail(s);with rolling walker (rail vs RW) PT Goal: Up/Down Stairs - Progress: Not met Pt will Perform Home Exercise Program: Independently PT Goal: Perform Home Exercise Program - Progress: Progressing toward goal  PT Evaluation Precautions/Restrictions  Restrictions Weight Bearing Restrictions: Yes RLE Weight Bearing: Non weight bearing Prior Functioning  Home Living Lives With: Spouse Receives Help From: Family Type of Home: House Home Layout: Multi-level;Other (Comment) Alternate Level Stairs-Rails: Right;Left Alternate Level Stairs-Number of Steps: 6-8 Home Access: Level entry Bathroom Shower/Tub: Tub/shower unit;Curtain Bathroom Toilet: Standard Bathroom Accessibility: Yes How Accessible: Accessible via walker Home Adaptive Equipment: Wheelchair - manual;Walker - rolling;Bedside commode/3-in-1 Prior Function Level of Independence: Independent with transfers;Independent with gait;Independent with basic ADLs Able to Take Stairs?:  Yes Driving: Yes Cognition Cognition Arousal/Alertness: Awake/alert Overall Cognitive Status: Appears within functional limits for tasks assessed Orientation Level: Oriented X4 Sensation/Coordination Sensation Light Touch: Impaired Detail Light Touch  Impaired Details: Impaired RLE (impaired sensation around stump) Extremity Assessment RLE Assessment RLE Assessment: Exceptions to [facility] RLE AROM (degrees) Overall AROM Right Lower Extremity: Within functional limits for tasks assessed RLE Strength RLE Overall Strength: Deficits;Due to precautions;Due to pain (Hip WFL; Pt able to complete SLR without assist) LLE Assessment LLE Assessment: Within Functional Limits Mobility (including Balance) Bed Mobility Bed Mobility: Yes Supine to Sit: 5: Supervision;HOB elevated (Comment degrees);With rails (30) Supine to Sit Details (indicate cue type and reason): VC for hand placement and sequencing. Pt did not require any full assist Sitting - Scoot to Edge of Bed: 6: Modified independent (Device/Increase time) Transfers Transfers: Yes Sit to Stand: 4: Min assist;With upper extremity assist;From bed Sit to Stand Details (indicate cue type and reason): VC for hand placement for safety; Assist for forward translation Stand to Sit: 4: Min assist;To chair/3-in-1;With upper extremity assist Stand to Sit Details: VC for hand placement. Pt able to control descent into chair Ambulation/Gait Ambulation/Gait: Yes Ambulation/Gait Assistance: 4: Min assist Ambulation/Gait Assistance Details (indicate cue type and reason): VC for proper gait sequencing with hopping. Pt encouraged to relax RLE  Ambulation Distance (Feet): 5 Feet Assistive device: Rolling walker Gait Pattern: Step-to pattern;Decreased step length - left;Trunk flexed Gait velocity: Decreased gait speed Stairs: No    Exercise  Amputee Exercises Quad Sets: AROM;Right;Strengthening;10 reps;Supine Knee Extension: AROM;Strengthening;Right;10  reps;Seated (LAQ) Straight Leg Raises: AROM;Strengthening;Right;10 reps;Supine End of Session PT - End of Session Equipment Utilized During Treatment: Gait belt Activity Tolerance: Patient tolerated treatment well Patient left: in chair;with call bell in reach;with family/visitor present Nurse Communication: Mobility status for transfers;Mobility status for ambulation General Behavior During Session: [facility] for tasks performed  Milana Kidney 10/19/2011, 1:14 PM  10/19/2011 Milana Kidney DPT PAGER: [phone] OFFICE: [phone]

## 2011-10-19 NOTE — Progress Notes (Signed)
  ANTICOAGULATION CONSULT NOTE - Follow Up Consult  Pharmacy Consult for Coumadin Indication: VTE prophylaxis s/p R BKA  Assessment: 77 yo M on Coumadin for VTE prophylaxis. INR below goal after first dose. No bleeding noted.  Goal of Therapy:  INR 2-3   Plan:  1. Coumadin 7.5 mg po tonight 2. INR daily  No Known Allergies  Patient Measurements: Height: 5\' 10"  (177.8 cm) Weight: 195 lb (88.451 kg) IBW/kg (Calculated) : 73   Vital Signs: Temp: 98.4 F (36.9 C) (01/12 0500) BP: 115/44 mmHg (01/12 0500) Pulse Rate: 90  (01/12 0500)  Labs:  Basename 10/19/11 0545 10/18/11 1835 10/18/11 0715  HGB 10.0* -- 11.7*  HCT 32.2* -- 37.4*  PLT 271 -- 315  APTT -- -- --  LABPROT 14.4 14.7 --  INR 1.10 1.13 --  HEPARINUNFRC -- -- --  CREATININE 1.00 -- 0.96  CKTOTAL -- -- --  CKMB -- -- --  TROPONINI -- -- --   Estimated Creatinine Clearance: 72.6 ml/min (by C-G formula based on Cr of 1).   Medications:  Scheduled:    . aspirin EC  81 mg Oral QPM  . ceFAZolin (ANCEF) IV  1 g Intravenous Q6H  . coumadin book   Does not apply Once  . docusate sodium  100 mg Oral BID  . ferrous sulfate  325 mg Oral TID PC  . gabapentin  300 mg Oral TID  . HYDROmorphone      . insulin aspart  0-15 Units Subcutaneous TID WC  . insulin aspart  3 Units Subcutaneous TID WC  . insulin glargine  40 Units Subcutaneous QHS  . lactobacillus acidophilus & bulgar  1 tablet Oral TID WC  . metFORMIN  1,000 mg Oral BID WC  . omega-3 acid ethyl esters  1 g Oral BID  . simvastatin  40 mg Oral QHS  . warfarin  7.5 mg Oral Once  . warfarin   Does not apply Once  . DISCONTD: acidophilus  1 capsule Oral TID  . DISCONTD: insulin glargine  100 Units Subcutaneous QHS  . DISCONTD: metFORMIN  1,000 mg Oral BID     Loura Back Danielle 10/19/2011,12:26 PM

## 2011-10-20 LAB — BASIC METABOLIC PANEL WITH GFR
BUN: 11 mg/dL (ref 6–23)
CO2: 32 meq/L (ref 19–32)
Calcium: 8.8 mg/dL (ref 8.4–10.5)
Chloride: 99 meq/L (ref 96–112)
Creatinine, Ser: 1.01 mg/dL (ref 0.50–1.35)
GFR calc Af Amer: 82 mL/min — ABNORMAL LOW (ref >90)
GFR calc non Af Amer: 71 mL/min — ABNORMAL LOW (ref >90)
Glucose, Bld: 84 mg/dL (ref 70–99)
Potassium: 4.3 meq/L (ref 3.5–5.1)
Sodium: 137 meq/L (ref 135–145)

## 2011-10-20 LAB — CBC
MCH: 26.9 pg (ref 26.0–34.0)
MCHC: 31.2 g/dL (ref 30.0–36.0)
MCV: 86.1 fL (ref 78.0–100.0)
Platelets: 256 10*3/uL (ref 150–400)
RDW: 14.4 % (ref 11.5–15.5)

## 2011-10-20 LAB — GLUCOSE, CAPILLARY: Glucose-Capillary: 88 mg/dL (ref 70–99)

## 2011-10-20 MED ORDER — OXYCODONE-ACETAMINOPHEN 5-325 MG PO TABS: 1.0000 | ORAL_TABLET | ORAL | Status: AC | PRN

## 2011-10-20 NOTE — Progress Notes (Signed)
Physical Therapy Treatment Patient Details Name: [name] MRN: [id number] DOB: 1935-01-21 Today's Date: 10/20/2011  PT Assessment/Plan  PT - Assessment/Plan Comments on Treatment Session: Pt continues to demo decreased activity tolerance & mobilty.  Deferred stair training despite encouragement- states "I'm fine with the steps.  I just scoot up on my bottom".    Pt very eager to D/C home today.   PT Plan: Discharge plan remains appropriate PT Frequency: Min 4X/week Follow Up Recommendations: Home health PT Equipment Recommended: None recommended by PT PT Goals  Acute Rehab PT Goals PT Goal: Supine/Side to Sit - Progress: Met PT Goal: Sit to Stand - Progress: Not met PT Goal: Stand to Sit - Progress: Not met PT Transfer Goal: Bed to Chair/Chair to Bed - Progress: Progressing toward goal PT Goal: Ambulate - Progress: Not met PT Goal: Perform Home Exercise Program - Progress: Progressing toward goal  PT Treatment Precautions/Restrictions  Restrictions Weight Bearing Restrictions: Yes RLE Weight Bearing: Non weight bearing Mobility (including Balance) Bed Mobility Supine to Sit: 6: Modified independent (Device/Increase time);HOB flat Sitting - Scoot to Edge of Bed: 6: Modified independent (Device/Increase time) Transfers Sit to Stand: 4: Min assist;From bed;With upper extremity assist Sit to Stand Details (indicate cue type and reason): (A) to achieve standing, balance, stabilization of RW.  Cues for initiation, hand placement.  Performed 4x's.  Pt seemed to do better if he places one hand on RW & pushes from seated surface with other hand.   Stand to Sit: 4: Min assist;With armrests;To bed;To chair/3-in-1;With upper extremity assist Stand to Sit Details: (A) to control descent & safety.  Cues for body positioning before sitting & hand placement.   Ambulation/Gait Ambulation/Gait Assistance: Other (comment) (Min Guard (A)) Ambulation/Gait Assistance Details (indicate cue type  and reason): Cues for safe technique to go sideways between narrow space, cues to relax RLE.  Close guarding for safety due to pt a little unsteady/shakey.  Fatigues quickly.   Ambulation Distance (Feet): 10 Feet (1 side of bed to the other. ) Assistive device: Rolling walker Gait Pattern: Step-to pattern;Decreased stance time - left Stairs: No Wheelchair Mobility Wheelchair Mobility: No    Exercise  General Exercises - Lower Extremity Quad Sets: Strengthening;Both;10 reps;Supine Gluteal Sets: Strengthening;10 reps;Both;Supine Long Arc Quad: Right;10 reps;Strengthening;Seated Hip ABduction/ADduction: Strengthening;Right;10 reps;Supine Straight Leg Raises: Strengthening;Right;10 reps;Supine End of Session PT - End of Session Equipment Utilized During Treatment: Gait belt Activity Tolerance: Patient limited by fatigue;Patient tolerated treatment well Patient left: in chair;with call bell in reach General Behavior During Session: [facility] for tasks performed Cognition: [facility] for tasks performed  Lara Mulch 10/20/2011, 11:57 AM [phone]

## 2011-10-20 NOTE — Discharge Summary (Signed)
Physician Discharge Summary  Patient ID: [name] MRN: [id number] DOB/AGE: 75-05-1935 75 y.o.  Admit date: 10/18/2011 Discharge date: 10/20/2011  Admission Diagnoses: Gangrene and osteomyelitis right foot and ankle  Discharge Diagnoses: Gangrene and osteomyelitis right foot and ankle  Active Problems:  * No active hospital problems. *    Discharged Condition: stable  Hospital Course: Patient's hospital course was essentially unremarkable he underwent a right transtibial amputation 10/18/2011. Postoperatively he progressed well with physical therapy and was discharged to home in stable condition. Patient did have some ischemic changes to his revascularization incision on the right groin. Patient was started on Silvadene dressing changes and we'll continue these at home. He will followup with vascular vein specialist for the right groin wound and followup with myself in 2 weeks.  Consults: none  Significant Diagnostic Studies: None  Treatments: surgery: Right transtibial amputation.  Discharge Exam: Blood pressure 124/73, pulse 90, temperature 99.7 F (37.6 C), temperature source Oral, resp. rate 18, height 5\' 10"  (1.778 m), weight 88.451 kg (195 lb), SpO2 97.00%. Incision/Wound: groin incision with ischemic changes at time of discharge wound care started. Surgical incision clean and dry.  Disposition: Home-Health Care Svc  Discharge Orders    Future Appointments: Provider: Department: Dept Phone: Center:   10/21/2011 10:00 AM [facility] [phone] [facility]   10/21/2011 11:00 AM V Durene Cal, MD [facility] [phone] [facility]     Future Orders Please Complete By Expires   Ambulatory referral to Home Health      Comments:   Please evaluate [name] for admission to [facility].  Disciplines requested: Physical Therapy  Services to provide: Strengthening Exercises  Physician to follow patient's care (the person listed here will be responsible for  signing ongoing orders): Referring Provider  Requested Start of Care Date: Tomorrow  Special Instructions:  Genevieve Norlander for home health physical therapy progressive ambulation range of motion of the right knee   Diet - low sodium heart healthy      Call MD / Call 911      Comments:   If you experience chest pain or shortness of breath, CALL 911 and be transported to the hospital emergency room.  If you develope a fever above 101 F, pus (white drainage) or increased drainage or redness at the wound, or calf pain, call your surgeon's office.   Constipation Prevention      Comments:   Drink plenty of fluids.  Prune juice may be helpful.  You may use a stool softener, such as Colace (over the counter) 100 mg twice a day.  Use MiraLax (over the counter) for constipation as needed.   Increase activity slowly as tolerated      Weight Bearing as taught in Physical Therapy      Comments:   Use a walker or crutches as instructed.     Medication List  As of 10/20/2011  8:28 AM   TAKE these medications         acidophilus Caps   Take 1 capsule by mouth 3 (three) times daily.      aspirin EC 81 MG tablet   Take 81 mg by mouth every evening.      doxycycline 100 MG tablet   Commonly known as: VIBRA-TABS   Take 100 mg by mouth 2 (two) times daily. Pt started on 12/28 for 15 days      gabapentin 100 MG capsule   Commonly known as: NEURONTIN   Take 300 mg by mouth  3 (three) times daily.      insulin aspart protamine-insulin aspart (70-30) 100 UNIT/ML injection   Commonly known as: NOVOLOG 70/30   Inject 25 Units into the skin See admin instructions. Takes 25 units twice daily to three times daily if eats a large meal with lunch and/or Supper      insulin glargine 100 UNIT/ML injection   Commonly known as: LANTUS   Inject 40 Units into the skin at bedtime.      metFORMIN 1000 MG tablet   Commonly known as: GLUCOPHAGE   Take 1,000 mg by mouth 2 (two) times daily.      multivitamins ther.  w/minerals Tabs   Take 1 tablet by mouth daily.      omega-3 acid ethyl esters 1 G capsule   Commonly known as: LOVAZA   Take 1 g by mouth 2 (two) times daily.      oxyCODONE-acetaminophen 5-325 MG per tablet   Commonly known as: PERCOCET   Take 1 tablet by mouth every 4 (four) hours as needed for pain.      simvastatin 40 MG tablet   Commonly known as: ZOCOR   Take 40 mg by mouth at bedtime.      Zinc 50 MG Caps   Take 1 capsule by mouth daily.           Follow-up Information    Follow up with Nadara Mustard, MD.   Contact information:   [address] [place] [address] [phone]          Signed: Nadara Mustard 10/20/2011, 8:28 AM

## 2011-10-21 ENCOUNTER — Encounter (HOSPITAL_COMMUNITY): Payer: Self-pay | Admitting: Orthopedic Surgery

## 2011-10-21 ENCOUNTER — Ambulatory Visit: Payer: Medicare Other | Admitting: Surgery

## 2011-10-21 ENCOUNTER — Encounter (HOSPITAL_BASED_OUTPATIENT_CLINIC_OR_DEPARTMENT_OTHER): Payer: MEDICARE

## 2011-10-21 ENCOUNTER — Other Ambulatory Visit: Payer: Medicare Other

## 2011-10-22 ENCOUNTER — Encounter: Payer: Self-pay | Admitting: Thoracic Diseases

## 2011-10-23 ENCOUNTER — Ambulatory Visit (INDEPENDENT_AMBULATORY_CARE_PROVIDER_SITE_OTHER): Payer: Medicare Other | Admitting: Thoracic Diseases

## 2011-10-23 VITALS — BP 132/63 | HR 79 | Resp 20

## 2011-10-23 DIAGNOSIS — Z09 Encounter for follow-up examination after completed treatment for conditions other than malignant neoplasm: Secondary | ICD-10-CM

## 2011-10-23 DIAGNOSIS — T8130XA Disruption of wound, unspecified, initial encounter: Secondary | ICD-10-CM

## 2011-10-23 NOTE — Patient Instructions (Signed)
Apply santyl ointment to right groin wound daily. Clean wound with saline, dry and apply ointment

## 2011-10-23 NOTE — Progress Notes (Signed)
[facility] [place]  Postoperative Visit Bypass Surgery Date of Surgery: 09/27/11 Right femoral to AT bypass with GSV Surgeon: Vivi Martens, MD CC: open right groin wound.  History of Present Illness  [name] is a 77 y.o. male who presents with open wound in right groin. Pt was seen last week and started on silver sulfidene to wound. They return today because there was some minimal drainage of wound/  He denies fever, chills purulent drainage or redness around wound.  Physical Examination  Filed Vitals:   10/23/11 1347  BP: 132/63  Pulse: 79  Resp: 20    Pt is A&O x 3  right lower extremity groin wound with exudative tissue the length of the wound, no erythema or purulence noted. Necrotic fat noted at base of wound  Procedure I&D of wound. Necrotic tissue and exudative tissue was sharply debrided with min bleeding. The wound is clean and does not go further than 1st suture layer.   Medical Decision Making  [name] is a 77 y.o. year old male who presents with open groin wound which is clean and without signs of infection.  A RX for santyl ointment to be used once daily to right groin wound was given. spouse given instructions regarding wound care and feels capable of doing dressing changes  Clinic MD: CS Edilia Bo, MD

## 2011-10-28 ENCOUNTER — Encounter: Payer: Self-pay | Admitting: Surgery

## 2011-10-28 ENCOUNTER — Ambulatory Visit (INDEPENDENT_AMBULATORY_CARE_PROVIDER_SITE_OTHER): Payer: Medicare Other | Admitting: Surgery

## 2011-10-28 VITALS — BP 147/82 | HR 100 | Temp 98.2°F | Resp 16 | Ht 70.0 in | Wt 195.0 lb

## 2011-10-28 DIAGNOSIS — Z09 Encounter for follow-up examination after completed treatment for conditions other than malignant neoplasm: Secondary | ICD-10-CM

## 2011-10-28 DIAGNOSIS — T8189XA Other complications of procedures, not elsewhere classified, initial encounter: Secondary | ICD-10-CM | POA: Insufficient documentation

## 2011-10-28 NOTE — Progress Notes (Signed)
The patient comes back today for wound check. In an attempt at limb salvage he underwent right femoral to anterior tibial bypass graft with saphenous vein on 09/27/2011. Unfortunately due to the ischemic nature of his wound he required below knee and the patient. His right groin incision has opened up he was seen last week and started on Santyl. He's back today without significant complaints. He denies fevers or chills or foul smelling drainage.  The wound was debrided today although the entire groin incision has opened up it is somewhat superficial. There is basically necrotic fat at the base of the wound. The bypass graft is not visualized.  Am recommending changing to wet-to-dry dressing changes twice a day I will see him back in 2 weeks for followup

## 2011-11-04 ENCOUNTER — Ambulatory Visit: Payer: Medicare Other | Admitting: Surgery

## 2011-11-08 ENCOUNTER — Encounter: Payer: Self-pay | Admitting: Surgery

## 2011-11-11 ENCOUNTER — Encounter: Payer: Self-pay | Admitting: Surgery

## 2011-11-11 ENCOUNTER — Ambulatory Visit (INDEPENDENT_AMBULATORY_CARE_PROVIDER_SITE_OTHER): Payer: Medicare Other | Admitting: Surgery

## 2011-11-11 VITALS — BP 144/78 | HR 92 | Temp 97.6°F | Resp 16 | Ht 70.0 in | Wt 195.0 lb

## 2011-11-11 DIAGNOSIS — M629 Disorder of muscle, unspecified: Secondary | ICD-10-CM | POA: Insufficient documentation

## 2011-11-11 MED ORDER — CIPROFLOXACIN HCL 500 MG PO TABS: 500.0000 mg | ORAL_TABLET | Freq: Two times a day (BID) | ORAL | Status: AC

## 2011-11-11 NOTE — Progress Notes (Signed)
The patient is back today for a wound check. He has a right groin wound from a bypass graft that was done for limb salvage. He ultimately underwent a right below knee amputation by Dr.Duda. I switched him to wet-to-dry dressing changes 2 weeks ago. He is tolerating his dressing changes. There is some light green drainage on the dressing however the wound does appear to be granulating in nicely. There is a eschar on the lateral side of his below knee incision that does not appear infected.  I am placing the patient on 2 weeks worth of Cipro. He will followup with me in 3 weeks

## 2011-11-29 ENCOUNTER — Encounter: Payer: Self-pay | Admitting: Surgery

## 2011-12-02 ENCOUNTER — Ambulatory Visit (INDEPENDENT_AMBULATORY_CARE_PROVIDER_SITE_OTHER): Payer: Medicare Other | Admitting: Surgery

## 2011-12-02 ENCOUNTER — Encounter: Payer: Self-pay | Admitting: Surgery

## 2011-12-02 VITALS — BP 152/84 | HR 96 | Resp 16 | Ht 70.0 in | Wt 195.0 lb

## 2011-12-02 DIAGNOSIS — M629 Disorder of muscle, unspecified: Secondary | ICD-10-CM

## 2011-12-02 DIAGNOSIS — I7092 Chronic total occlusion of artery of the extremities: Secondary | ICD-10-CM | POA: Insufficient documentation

## 2011-12-02 NOTE — Progress Notes (Signed)
The patient is back today for a wound check. He underwent a right femoral-popliteal bypass graft in an attempt to limb salvage for a large ulcer on his right foot. Ultimately he required below knee amputation which was performed by Dr. Lajoyce Corners.  He developed a right groin wound. We have been doing dressing changes consisting of wet-to-dry twice a day. He's back today for a wound check. His groin is granulating in nicely. There is pink beefy red tissue at the base. The wound has nearly contracted . I feel this is continuing to heal nicely and should be resolved the next month. Scheduling him to come by to see me in one month.

## 2011-12-26 DIAGNOSIS — I70269 Atherosclerosis of native arteries of extremities with gangrene, unspecified extremity: Secondary | ICD-10-CM

## 2011-12-27 ENCOUNTER — Encounter: Payer: Self-pay | Admitting: Surgery

## 2011-12-30 ENCOUNTER — Ambulatory Visit (INDEPENDENT_AMBULATORY_CARE_PROVIDER_SITE_OTHER): Payer: Medicare Other | Admitting: Surgery

## 2011-12-30 ENCOUNTER — Encounter: Payer: Self-pay | Admitting: Surgery

## 2011-12-30 VITALS — BP 155/66 | HR 96 | Temp 97.8°F | Ht 70.0 in | Wt 195.0 lb

## 2011-12-30 DIAGNOSIS — I798 Other disorders of arteries, arterioles and capillaries in diseases classified elsewhere: Secondary | ICD-10-CM

## 2011-12-30 DIAGNOSIS — E1159 Type 2 diabetes mellitus with other circulatory complications: Secondary | ICD-10-CM | POA: Insufficient documentation

## 2011-12-30 DIAGNOSIS — I70219 Atherosclerosis of native arteries of extremities with intermittent claudication, unspecified extremity: Secondary | ICD-10-CM

## 2011-12-30 NOTE — Progress Notes (Signed)
[facility]   Patient name: [name] MRN: [id number] DOB: 12-24-1934 Sex: male     Chief Complaint  Patient presents with  . Routine Post Op    wound check right groin    HISTORY OF PRESENT ILLNESS: With the patient comes back today for a wound check. He is status post right femoral-popliteal bypass graft and an attempt at limb salvage for a large ulcer on his right foot. Ultimately he require below knee amputation by Dr. Lajoyce Corners.  He developed a right groin wound after his amputation. We have been treating him with wet to dry dressing changes. He comes back for a wound check. He is on doing his dressing changes once a day it is nearly healed. He has no complaints. He is due to get a prosthesis fitting today  Past Medical History  Diagnosis Date  . Diabetes mellitus   . Hypercholesteremia     Past Surgical History  Procedure Date  . Spine surgery   . Fracture surgery 08-2011    ORIF right ankle  . Femoral-tibial bypass graft 09/27/2011    Procedure: BYPASS GRAFT FEMORAL-TIBIAL ARTERY;  Surgeon: Juleen China, MD;  Location: [facility] OR;  Service: Vascular;  Laterality: Right;  Procedure started @1607           Femoral/Anterior Tibial Bypass right .  Right Femoral Endarterectomy.  . Tibia im nail insertion 09/27/2011    Procedure: INTRAMEDULLARY (IM) NAIL TIBIAL;  Surgeon: Nadara Mustard, MD;  Location: [facility] OR;  Service: Orthopedics;  Laterality: Right;  Procedure started @1510 -1536 in addition: Removal of Hardware Right Fibula  . Back surgery 1990's  . Amputation 10/18/2011    Procedure: AMPUTATION BELOW KNEE;  Surgeon: Nadara Mustard, MD;  Location: [facility] OR;  Service: Orthopedics;  Laterality: Right;  Right Below Knee Amputation    History   Social History  . Marital Status: Married    Spouse Name: N/A    Number of Children: N/A  . Years of Education: N/A   Occupational History  . Not on file.   Social History Main Topics  . Smoking status: Never  Smoker   . Smokeless tobacco: Not on file  . Alcohol Use: No  . Drug Use: No  . Sexually Active:    Other Topics Concern  . Not on file   Social History Narrative  . No narrative on file    Family History  Problem Relation Age of Onset  . Diabetes Other     Allergies as of 12/30/2011  . (No Known Allergies)    Current Outpatient Prescriptions on File Prior to Visit  Medication Sig Dispense Refill  . acidophilus (RISAQUAD) CAPS Take 1 capsule by mouth 3 (three) times daily.        [name] aspirin EC 81 MG tablet Take 81 mg by mouth every evening.        [name] doxycycline (VIBRA-TABS) 100 MG tablet Take 100 mg by mouth 2 (two) times daily. Pt started on 12/28 for 15 days       . gabapentin (NEURONTIN) 100 MG capsule Take 300 mg by mouth 3 (three) times daily.        . insulin aspart protamine-insulin aspart (NOVOLOG 70/30) (70-30) 100 UNIT/ML injection Inject 25 Units into the skin See admin instructions. Takes 25 units twice daily to three times daily if eats a large meal with lunch and/or Supper      . insulin glargine (LANTUS) 100 UNIT/ML injection Inject  40 Units into the skin at bedtime.        . metFORMIN (GLUCOPHAGE) 1000 MG tablet Take 1,000 mg by mouth 2 (two) times daily.        . Multiple Vitamins-Minerals (MULTIVITAMINS THER. W/MINERALS) TABS Take 1 tablet by mouth daily.        [name] omega-3 acid ethyl esters (LOVAZA) 1 G capsule Take 1 g by mouth 2 (two) times daily.        . simvastatin (ZOCOR) 40 MG tablet Take 40 mg by mouth at bedtime.        . Zinc 50 MG CAPS Take 1 capsule by mouth daily.           REVIEW OF SYSTEMS: All systems are negative as documented in the encounter form  PHYSICAL EXAMINATION:   Vital signs are BP 155/66  Pulse 96  Temp(Src) 97.8 F (36.6 C) (Oral)  Ht 5\' 10"  (1.778 m)  Wt 195 lb (88.451 kg)  BMI 27.98 kg/m2  SpO2 97% General: The patient appears their stated age. HEENT:  No gross abnormalities Pulmonary:  Non labored breathing Abdomen:  Soft and non-tender Musculoskeletal: There are no major deformities. Neurologic: No focal weakness or paresthesias are detected, Skin: The right groin wound has nearly healed there is only a small area of superficial separation Psychiatric: The patient has normal affect. Cardiovascular: Left pedal pulses are not palpable   Diagnostic Studies None  Assessment: Right groin wound Plan: I cauterize an area of approximately 2 x 2 millimeters today with silver nitrate. His wound has essentially healed. He is due to get fitted for a prosthesis today. I do not think he'll have any further wound issues in his right groin. He'll contact me if he does otherwise I will see him on a when necessary basis  V. Charlena Cross, M.D. [facility] [place] Office: [phone] Pager:  [phone]

## 2012-02-27 ENCOUNTER — Ambulatory Visit: Payer: Medicare Other | Attending: Orthopedic Surgery | Admitting: Physical Therapy

## 2012-02-27 DIAGNOSIS — R269 Unspecified abnormalities of gait and mobility: Secondary | ICD-10-CM | POA: Insufficient documentation

## 2012-02-27 DIAGNOSIS — R5381 Other malaise: Secondary | ICD-10-CM | POA: Insufficient documentation

## 2012-02-27 DIAGNOSIS — IMO0001 Reserved for inherently not codable concepts without codable children: Secondary | ICD-10-CM | POA: Insufficient documentation

## 2012-02-27 DIAGNOSIS — M6281 Muscle weakness (generalized): Secondary | ICD-10-CM | POA: Insufficient documentation

## 2012-03-03 ENCOUNTER — Ambulatory Visit: Payer: Medicare Other | Admitting: Physical Therapy

## 2012-03-06 ENCOUNTER — Ambulatory Visit: Payer: Medicare Other | Admitting: Physical Therapy

## 2012-03-09 ENCOUNTER — Encounter: Payer: Medicare Other | Admitting: Physical Therapy

## 2012-03-10 ENCOUNTER — Ambulatory Visit: Payer: Medicare Other | Attending: Orthopedic Surgery | Admitting: Physical Therapy

## 2012-03-10 DIAGNOSIS — M6281 Muscle weakness (generalized): Secondary | ICD-10-CM | POA: Insufficient documentation

## 2012-03-10 DIAGNOSIS — R5381 Other malaise: Secondary | ICD-10-CM | POA: Insufficient documentation

## 2012-03-10 DIAGNOSIS — R269 Unspecified abnormalities of gait and mobility: Secondary | ICD-10-CM | POA: Insufficient documentation

## 2012-03-10 DIAGNOSIS — IMO0001 Reserved for inherently not codable concepts without codable children: Secondary | ICD-10-CM | POA: Insufficient documentation

## 2012-03-12 ENCOUNTER — Ambulatory Visit: Payer: Medicare Other | Admitting: Physical Therapy

## 2012-03-18 ENCOUNTER — Ambulatory Visit: Payer: Medicare Other | Admitting: Physical Therapy

## 2012-03-25 ENCOUNTER — Encounter: Payer: Medicare Other | Admitting: Physical Therapy

## 2012-04-01 ENCOUNTER — Ambulatory Visit: Payer: Medicare Other | Admitting: Physical Therapy

## 2012-04-02 ENCOUNTER — Encounter (HOSPITAL_COMMUNITY): Payer: Self-pay

## 2012-04-08 ENCOUNTER — Ambulatory Visit: Payer: Medicare Other | Attending: Orthopedic Surgery | Admitting: Physical Therapy

## 2012-04-08 DIAGNOSIS — R5381 Other malaise: Secondary | ICD-10-CM | POA: Insufficient documentation

## 2012-04-08 DIAGNOSIS — R269 Unspecified abnormalities of gait and mobility: Secondary | ICD-10-CM | POA: Insufficient documentation

## 2012-04-08 DIAGNOSIS — IMO0001 Reserved for inherently not codable concepts without codable children: Secondary | ICD-10-CM | POA: Insufficient documentation

## 2012-04-08 DIAGNOSIS — M6281 Muscle weakness (generalized): Secondary | ICD-10-CM | POA: Insufficient documentation

## 2012-06-04 ENCOUNTER — Encounter (HOSPITAL_COMMUNITY): Payer: Self-pay

## 2013-05-28 ENCOUNTER — Encounter: Payer: Self-pay | Admitting: Cardiovascular Disease

## 2013-05-28 ENCOUNTER — Encounter: Payer: Self-pay | Admitting: *Deleted

## 2013-05-28 ENCOUNTER — Ambulatory Visit (INDEPENDENT_AMBULATORY_CARE_PROVIDER_SITE_OTHER): Payer: Medicare Other | Admitting: Cardiovascular Disease

## 2013-05-28 VITALS — BP 116/70 | HR 72 | Ht 70.5 in | Wt 209.8 lb

## 2013-05-28 DIAGNOSIS — E1159 Type 2 diabetes mellitus with other circulatory complications: Secondary | ICD-10-CM

## 2013-05-28 DIAGNOSIS — I2510 Atherosclerotic heart disease of native coronary artery without angina pectoris: Secondary | ICD-10-CM

## 2013-05-28 DIAGNOSIS — I10 Essential (primary) hypertension: Secondary | ICD-10-CM

## 2013-05-28 NOTE — Assessment & Plan Note (Signed)
Well controlled.  Continue current medications and low sodium Dash type diet.    

## 2013-05-28 NOTE — Progress Notes (Signed)
Patient ID: [name], male   DOB: 05/11/1935, 78 y.o.   MRN: [id number] 78 yo referred by Dr Yetta Flock for CAD.  Patient had a bad experience at [place] and wants care in [place].  History of chest pain with CAD.  Had stent placed to LAD in [place] by Dr Rhona Leavens in July of 2013.  No chest pain since.  Diabetic.  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 Difficulty getting around with prosthetic device.  No dyspnea palpitation or syncope.  Compliant with meds  Has had f/u with Dr Lear Ng for PVD.    ROS: Denies fever, malais, weight loss, blurry vision, decreased visual acuity, cough, sputum, SOB, hemoptysis, pleuritic pain, palpitaitons, heartburn, abdominal pain, melena, lower extremity edema, claudication, or rash.  All other systems reviewed and negative   General: Affect appropriate Healthy:  appears stated age HEENT: normal Neck supple with no adenopathy JVP normal no bruits no thyromegaly Lungs clear with no wheezing and good diaphragmatic motion Heart:  S1/S2 no murmur,rub, gallop or click PMI normal Abdomen: benighn, BS positve, no tenderness, no AAA no bruit.  No HSM or HJR Distal pulses intact with no bruits No edema Neuro non-focal Skin warm and dry No muscular weakness  Medications Current Outpatient Prescriptions  Medication Sig Dispense Refill  . aspirin EC 81 MG tablet Take 81 mg by mouth every evening.        . clopidogrel (PLAVIX) 75 MG tablet Take 75 mg by mouth daily.      . fenofibrate 160 MG tablet Take 160 mg by mouth daily.      . insulin glargine (LANTUS) 100 UNIT/ML injection Inject 40 Units into the skin at bedtime.        . insulin lispro protamine-lispro (HUMALOG MIX 75/25) (75-25) 100 UNIT/ML SUSP injection Inject 25 Units into the skin 2 (two) times daily with a meal.      . metoprolol succinate (TOPROL-XL) 25 MG 24 hr tablet Take 25 mg by mouth 2 (two) times daily.      [name] omega-3 acid ethyl esters (LOVAZA) 1 G capsule Take  1 g by mouth 2 (two) times daily.        . simvastatin (ZOCOR) 40 MG tablet Take 40 mg by mouth at bedtime.        . metFORMIN (GLUCOPHAGE) 1000 MG tablet Take 1,000 mg by mouth 2 (two) times daily.         No current facility-administered medications for this visit.    Allergies Review of patient's allergies indicates no known allergies.  Family History: Family History  Problem Relation Age of Onset  . Diabetes Other     Social History: History   Social History  . Marital Status: Married    Spouse Name: N/A    Number of Children: N/A  . Years of Education: N/A   Occupational History  . Not on file.   Social History Main Topics  . Smoking status: Never Smoker   . Smokeless tobacco: Not on file  . Alcohol Use: Yes     Comment: DRINKS WINE  . Drug Use: No  . Sexual Activity: Not on file   Other Topics Concern  . Not on file   Social History Narrative  . No narrative on file    Electrocardiogram:  SR rate 72 RBBB LAFB old anterolateral MI  Assessment and Plan

## 2013-05-28 NOTE — Patient Instructions (Addendum)
Your physician wants you to follow-up in:  6 MONTHS WITH DR NISHAN  You will receive a reminder letter in the mail two months in advance. If you don't receive a letter, please call our office to schedule the follow-up appointment. Your physician recommends that you continue on your current medications as directed. Please refer to the Current Medication list given to you today. 

## 2013-05-28 NOTE — Assessment & Plan Note (Signed)
Not clear why he is not on ACE F/U primary for microalbumin and A1c

## 2013-05-28 NOTE — Assessment & Plan Note (Signed)
Will get records from [place]  NO angina continue ASA/Plavix and beta blocker

## 2013-06-11 ENCOUNTER — Telehealth: Payer: Self-pay | Admitting: Cardiovascular Disease

## 2013-06-11 NOTE — Telephone Encounter (Signed)
Records Rec From [facility] will hold For Christine/Nishan until they  Return on Monday 06/11/13/KM

## 2013-11-29 ENCOUNTER — Ambulatory Visit: Payer: Medicare Other | Admitting: Cardiovascular Disease

## 2013-11-30 ENCOUNTER — Ambulatory Visit: Payer: Medicare Other | Admitting: Cardiovascular Disease

## 2013-12-10 ENCOUNTER — Ambulatory Visit (INDEPENDENT_AMBULATORY_CARE_PROVIDER_SITE_OTHER): Payer: Commercial Managed Care - HMO | Admitting: Cardiovascular Disease

## 2013-12-10 ENCOUNTER — Encounter (INDEPENDENT_AMBULATORY_CARE_PROVIDER_SITE_OTHER): Payer: Self-pay

## 2013-12-10 ENCOUNTER — Encounter: Payer: Self-pay | Admitting: Cardiovascular Disease

## 2013-12-10 VITALS — BP 126/64 | HR 100 | Ht 70.5 in | Wt 213.0 lb

## 2013-12-10 DIAGNOSIS — I10 Essential (primary) hypertension: Secondary | ICD-10-CM

## 2013-12-10 DIAGNOSIS — I2510 Atherosclerotic heart disease of native coronary artery without angina pectoris: Secondary | ICD-10-CM

## 2013-12-10 DIAGNOSIS — I739 Peripheral vascular disease, unspecified: Secondary | ICD-10-CM

## 2013-12-10 DIAGNOSIS — E1159 Type 2 diabetes mellitus with other circulatory complications: Secondary | ICD-10-CM

## 2013-12-10 NOTE — Progress Notes (Signed)
Patient ID: Kevin DumasDavid L Norem, male   DOB: December 04, 1934, 79 y.o.   MRN: [id number] 79 yo referred by Dr Yetta FlockHodges for CAD. Patient had a bad experience at [facility] and wants care in [place]. History of chest pain with CAD. Had stent placed to LAD in [place] by Dr Rhona Leavenshiu in July of 2013. No chest pain since. Diabetic. 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 Difficulty getting around with prosthetic device. No dyspnea palpitation or syncope. Compliant with meds Has had f/u with Dr [name] for PVD.   Sees Hodges in [place] for primary  Some renal failure related to ? ACE or metformin now resolved Needs to see Lajoyce CornersDuda for new prosthetic liner    ROS: Denies fever, malais, weight loss, blurry vision, decreased visual acuity, cough, sputum, SOB, hemoptysis, pleuritic pain, palpitaitons, heartburn, abdominal pain, melena, lower extremity edema, claudication, or rash.  All other systems reviewed and negative  General: Affect appropriate Over weight white male  HEENT: normal Neck supple with no adenopathy JVP normal no bruits no thyromegaly Lungs clear with no wheezing and good diaphragmatic motion Heart:  S1/S2 no murmur, no rub, gallop or click PMI normal Abdomen: benighn, BS positve, no tenderness, no AAA no bruit.  No HSM or HJR S/P right BKA  No edema Neuro non-focal Skin warm and dry No muscular weakness   Current Outpatient Prescriptions  Medication Sig Dispense Refill  . aspirin EC 81 MG tablet Take 81 mg by mouth every evening.        . clopidogrel (PLAVIX) 75 MG tablet Take 75 mg by mouth daily.      . fenofibrate 160 MG tablet Take 160 mg by mouth daily.      . insulin glargine (LANTUS) 100 UNIT/ML injection Inject 40 Units into the skin at bedtime.        . metoprolol succinate (TOPROL-XL) 25 MG 24 hr tablet Take 25 mg by mouth 2 (two) times daily.      . simvastatin (ZOCOR) 40 MG tablet Take 40 mg by mouth at bedtime.         No current  facility-administered medications for this visit.    Allergies  Review of patient's allergies indicates no known allergies.  Electrocardiogram: 8/14 SR rate 72 RBBB LAFB   Assessment and Plan

## 2013-12-10 NOTE — Assessment & Plan Note (Signed)
F/U Dr [name] No claudication in LLE

## 2013-12-10 NOTE — Patient Instructions (Signed)
Your physician wants you to follow-up in: YEAR WITH DR NISHAN  You will receive a reminder letter in the mail two months in advance. If you don't receive a letter, please call our office to schedule the follow-up appointment.  Your physician recommends that you continue on your current medications as directed. Please refer to the Current Medication list given to you today. 

## 2013-12-10 NOTE — Assessment & Plan Note (Signed)
Discussed low carb diet.  Target hemoglobin A1c is 6.5 or less.  Continue current medications. Apparently not on ACE due to renal issues

## 2013-12-10 NOTE — Assessment & Plan Note (Signed)
Well controlled.  Continue current medications and low sodium Dash type diet.    

## 2013-12-10 NOTE — Assessment & Plan Note (Signed)
Stable with no angina and good activity level.  Continue medical Rx  

## 2014-08-15 ENCOUNTER — Inpatient Hospital Stay (HOSPITAL_COMMUNITY)
Admission: AD | Admit: 2014-08-15 | Discharge: 2014-08-19 | DRG: 301 | Disposition: A | Discharge status: Home / Self Care | Payer: Medicare HMO | Source: Other Acute Inpatient Hospital | Attending: General Surgery | Admitting: General Surgery

## 2014-08-15 ENCOUNTER — Inpatient Hospital Stay (HOSPITAL_COMMUNITY)
Admission: AD | Admit: 2014-08-15 | Payer: Self-pay | Source: Other Acute Inpatient Hospital | Admitting: Vascular Surgery

## 2014-08-15 DIAGNOSIS — K219 Gastro-esophageal reflux disease without esophagitis: Secondary | ICD-10-CM | POA: Diagnosis not present

## 2014-08-15 DIAGNOSIS — Z7982 Long term (current) use of aspirin: Secondary | ICD-10-CM | POA: Diagnosis not present

## 2014-08-15 DIAGNOSIS — Z955 Presence of coronary angioplasty implant and graft: Secondary | ICD-10-CM | POA: Diagnosis not present

## 2014-08-15 DIAGNOSIS — K8020 Calculus of gallbladder without cholecystitis without obstruction: Secondary | ICD-10-CM

## 2014-08-15 DIAGNOSIS — I714 Abdominal aortic aneurysm, without rupture, unspecified: Secondary | ICD-10-CM | POA: Diagnosis present

## 2014-08-15 DIAGNOSIS — E119 Type 2 diabetes mellitus without complications: Secondary | ICD-10-CM | POA: Diagnosis present

## 2014-08-15 DIAGNOSIS — E785 Hyperlipidemia, unspecified: Secondary | ICD-10-CM | POA: Diagnosis present

## 2014-08-15 DIAGNOSIS — Z794 Long term (current) use of insulin: Secondary | ICD-10-CM

## 2014-08-15 DIAGNOSIS — Z6829 Body mass index (BMI) 29.0-29.9, adult: Secondary | ICD-10-CM

## 2014-08-15 DIAGNOSIS — Z79899 Other long term (current) drug therapy: Secondary | ICD-10-CM

## 2014-08-15 DIAGNOSIS — Z7902 Long term (current) use of antithrombotics/antiplatelets: Secondary | ICD-10-CM | POA: Diagnosis not present

## 2014-08-15 DIAGNOSIS — I252 Old myocardial infarction: Secondary | ICD-10-CM

## 2014-08-15 DIAGNOSIS — R109 Unspecified abdominal pain: Secondary | ICD-10-CM | POA: Diagnosis present

## 2014-08-15 DIAGNOSIS — I2510 Atherosclerotic heart disease of native coronary artery without angina pectoris: Secondary | ICD-10-CM | POA: Diagnosis present

## 2014-08-15 DIAGNOSIS — Z89511 Acquired absence of right leg below knee: Secondary | ICD-10-CM

## 2014-08-15 DIAGNOSIS — R1013 Epigastric pain: Secondary | ICD-10-CM

## 2014-08-15 DIAGNOSIS — K8000 Calculus of gallbladder with acute cholecystitis without obstruction: Secondary | ICD-10-CM | POA: Diagnosis present

## 2014-08-15 DIAGNOSIS — Z01810 Encounter for preprocedural cardiovascular examination: Secondary | ICD-10-CM

## 2014-08-15 DIAGNOSIS — R0902 Hypoxemia: Secondary | ICD-10-CM | POA: Diagnosis not present

## 2014-08-15 HISTORY — DX: Abdominal aortic aneurysm, without rupture, unspecified: I71.40

## 2014-08-15 HISTORY — DX: Abdominal aortic aneurysm, without rupture: I71.4

## 2014-08-15 HISTORY — DX: Peripheral vascular disease, unspecified: I73.9

## 2014-08-15 LAB — COMPREHENSIVE METABOLIC PANEL
ALBUMIN: 3.4 g/dL — AB (ref 3.5–5.2)
ALK PHOS: 46 U/L (ref 39–117)
ALT: 16 U/L (ref 0–53)
AST: 19 U/L (ref 0–37)
Anion gap: 13 (ref 5–15)
BUN: 16 mg/dL (ref 6–23)
CALCIUM: 9.1 mg/dL (ref 8.4–10.5)
CO2: 27 mEq/L (ref 19–32)
Chloride: 100 mEq/L (ref 96–112)
Creatinine, Ser: 1.41 mg/dL — ABNORMAL HIGH (ref 0.50–1.35)
GFR calc non Af Amer: 47 mL/min — ABNORMAL LOW (ref >90)
GFR, EST AFRICAN AMERICAN: 54 mL/min — AB (ref >90)
GLUCOSE: 123 mg/dL — AB (ref 70–99)
POTASSIUM: 4.2 meq/L (ref 3.7–5.3)
SODIUM: 140 meq/L (ref 137–147)
TOTAL PROTEIN: 7.1 g/dL (ref 6.0–8.3)
Total Bilirubin: 0.3 mg/dL (ref 0.3–1.2)

## 2014-08-15 LAB — URINALYSIS, ROUTINE W REFLEX MICROSCOPIC
Bilirubin Urine: NEGATIVE
GLUCOSE, UA: 250 mg/dL — AB
Hgb urine dipstick: NEGATIVE
KETONES UR: NEGATIVE mg/dL
LEUKOCYTES UA: NEGATIVE
NITRITE: NEGATIVE
PH: 6 (ref 5.0–8.0)
PROTEIN: NEGATIVE mg/dL
Specific Gravity, Urine: >1.046 — ABNORMAL HIGH (ref 1.005–1.030)
Urobilinogen, UA: 0.2 mg/dL (ref 0.0–1.0)

## 2014-08-15 LAB — PROTIME-INR
INR: 0.99 (ref 0.00–1.49)
Prothrombin Time: 13.2 seconds (ref 11.6–15.2)

## 2014-08-15 LAB — GLUCOSE, CAPILLARY: GLUCOSE-CAPILLARY: 120 mg/dL — AB (ref 70–99)

## 2014-08-15 LAB — CBC
HCT: 41.4 % (ref 39.0–52.0)
Hemoglobin: 13.7 g/dL (ref 13.0–17.0)
MCH: 29.8 pg (ref 26.0–34.0)
MCHC: 33.1 g/dL (ref 30.0–36.0)
MCV: 90.2 fL (ref 78.0–100.0)
PLATELETS: 224 10*3/uL (ref 150–400)
RBC: 4.59 MIL/uL (ref 4.22–5.81)
RDW: 13.4 % (ref 11.5–15.5)
WBC: 13.2 10*3/uL — ABNORMAL HIGH (ref 4.0–10.5)

## 2014-08-15 MED ORDER — PHENOL 1.4 % MT LIQD
1.0000 | OROMUCOSAL | Status: DC | PRN
  Filled 2014-08-15: qty 177

## 2014-08-15 MED ORDER — LABETALOL HCL 5 MG/ML IV SOLN
10.0000 mg | INTRAVENOUS | Status: DC | PRN
  Filled 2014-08-15: qty 4

## 2014-08-15 MED ORDER — DOCUSATE SODIUM 100 MG PO CAPS
100.0000 mg | ORAL_CAPSULE | Freq: Two times a day (BID) | ORAL | Status: DC
  Administered 2014-08-15 – 2014-08-19 (×7): 100 mg via ORAL
  Filled 2014-08-15 (×9): qty 1

## 2014-08-15 MED ORDER — ACETAMINOPHEN 650 MG RE SUPP: 325.0000 mg | RECTAL | Status: DC | PRN

## 2014-08-15 MED ORDER — PANTOPRAZOLE SODIUM 40 MG PO TBEC
40.0000 mg | DELAYED_RELEASE_TABLET | Freq: Every day | ORAL | Status: DC
  Administered 2014-08-16 – 2014-08-18 (×3): 40 mg via ORAL
  Filled 2014-08-15 (×2): qty 1

## 2014-08-15 MED ORDER — MORPHINE SULFATE 2 MG/ML IJ SOLN
2.0000 mg | INTRAMUSCULAR | Status: DC | PRN
  Administered 2014-08-15: 2 mg via INTRAVENOUS
  Administered 2014-08-16: 4 mg via INTRAVENOUS
  Administered 2014-08-17: 2 mg via INTRAVENOUS
  Administered 2014-08-17: 4 mg via INTRAVENOUS
  Administered 2014-08-17 – 2014-08-18 (×2): 2 mg via INTRAVENOUS
  Filled 2014-08-15 (×3): qty 1
  Filled 2014-08-15 (×2): qty 2
  Filled 2014-08-15: qty 1

## 2014-08-15 MED ORDER — METOPROLOL SUCCINATE ER 25 MG PO TB24
25.0000 mg | ORAL_TABLET | Freq: Two times a day (BID) | ORAL | Status: DC
  Administered 2014-08-16 – 2014-08-19 (×7): 25 mg via ORAL
  Filled 2014-08-15 (×9): qty 1

## 2014-08-15 MED ORDER — ONDANSETRON HCL 4 MG/2ML IJ SOLN: 4.0000 mg | Freq: Four times a day (QID) | INTRAMUSCULAR | Status: DC | PRN

## 2014-08-15 MED ORDER — SODIUM CHLORIDE 0.45 % IV SOLN
INTRAVENOUS | Status: DC
  Administered 2014-08-16 – 2014-08-18 (×3): via INTRAVENOUS

## 2014-08-15 MED ORDER — GUAIFENESIN-DM 100-10 MG/5ML PO SYRP: 15.0000 mL | ORAL_SOLUTION | ORAL | Status: DC | PRN

## 2014-08-15 MED ORDER — METOPROLOL TARTRATE 1 MG/ML IV SOLN: 2.0000 mg | INTRAVENOUS | Status: DC | PRN

## 2014-08-15 MED ORDER — ALUM & MAG HYDROXIDE-SIMETH 200-200-20 MG/5ML PO SUSP: 15.0000 mL | ORAL | Status: DC | PRN

## 2014-08-15 MED ORDER — POTASSIUM CHLORIDE CRYS ER 20 MEQ PO TBCR: 20.0000 meq | EXTENDED_RELEASE_TABLET | Freq: Once | ORAL | Status: DC

## 2014-08-15 MED ORDER — ACETAMINOPHEN 325 MG PO TABS: 325.0000 mg | ORAL_TABLET | ORAL | Status: DC | PRN

## 2014-08-15 MED ORDER — INSULIN ASPART 100 UNIT/ML ~~LOC~~ SOLN
0.0000 [IU] | SUBCUTANEOUS | Status: DC
  Administered 2014-08-16: 4 [IU] via SUBCUTANEOUS
  Administered 2014-08-16: 3 [IU] via SUBCUTANEOUS
  Administered 2014-08-16: 4 [IU] via SUBCUTANEOUS
  Administered 2014-08-16 – 2014-08-17 (×3): 3 [IU] via SUBCUTANEOUS
  Administered 2014-08-17: 4 [IU] via SUBCUTANEOUS
  Administered 2014-08-17: 3 [IU] via SUBCUTANEOUS
  Administered 2014-08-17 – 2014-08-18 (×3): 4 [IU] via SUBCUTANEOUS
  Administered 2014-08-18: 3 [IU] via SUBCUTANEOUS
  Administered 2014-08-18: 7 [IU] via SUBCUTANEOUS
  Administered 2014-08-18 – 2014-08-19 (×2): 4 [IU] via SUBCUTANEOUS

## 2014-08-15 MED ORDER — HYDRALAZINE HCL 20 MG/ML IJ SOLN: 5.0000 mg | INTRAMUSCULAR | Status: DC | PRN

## 2014-08-15 NOTE — Progress Notes (Signed)
Labwork reviewed  CBC    Component Value Date/Time   WBC 13.2* 08/15/2014 2133   RBC 4.59 08/15/2014 2133   HGB 13.7 08/15/2014 2133   HCT 41.4 08/15/2014 2133   PLT 224 08/15/2014 2133   MCV 90.2 08/15/2014 2133   MCH 29.8 08/15/2014 2133   MCHC 33.1 08/15/2014 2133   RDW 13.4 08/15/2014 2133    CMP     Component Value Date/Time   NA 140 08/15/2014 2133   K 4.2 08/15/2014 2133   CL 100 08/15/2014 2133   CO2 27 08/15/2014 2133   GLUCOSE 123* 08/15/2014 2133   BUN 16 08/15/2014 2133   CREATININE 1.41* 08/15/2014 2133   CALCIUM 9.1 08/15/2014 2133   PROT 7.1 08/15/2014 2133   ALBUMIN 3.4* 08/15/2014 2133   AST 19 08/15/2014 2133   ALT 16 08/15/2014 2133   ALKPHOS 46 08/15/2014 2133   BILITOT 0.3 08/15/2014 2133   GFRNONAA 47* 08/15/2014 2133   GFRAA 54* 08/15/2014 2133   UA + glucose   Mild leukocytosis Mild renal dysfunction No anemia Gentle IV hydration overnight Repeat labwork AM  Fabienne Brunsharles Fields, MD [facility] [place] Office: [phone] Pager: [phone]

## 2014-08-15 NOTE — H&P (Signed)
[facility] [place] HISTORY AND PHYSICAL   History of Present Illness:  Patient is a 79 y.o. year old male who presents for evaluation of AAA.  Pt developed mid abdominal pain approximately 4 am today.  It has been steady in character not worse or better.  It is primarily midline abdominal pain.  He does have a family history of AAA.  His father died of AAA.  Pt had normal BM today.  He denies fever or chills, no syncope, no chest pain or dyspnea, no back pain.  No nausea or vomiting.  [facility] showed a single gallstone and 4.8 cm non ruptured AAA.  Pt had CT several years ago which showed AAA 3.7 cm but then apparently was lost to follow up.  Pt states no scans in several years.  Pt has seen Dr [name] in the past and had Right Fem AT bypass and eventually right BKA in 2012.    Other medical problems include diabetes, CAD (Dr Eden EmmsNishan), elevated cholesterol.  Pt is minimally ambulatory on a prosthetic leg.  Past Medical History  Diagnosis Date  . Diabetes mellitus   . Hypercholesteremia   . Coronary artery disease     Past Surgical History  Procedure Laterality Date  . Spine surgery    . Fracture surgery  08-2011    ORIF right ankle  . Femoral-tibial bypass graft  09/27/2011    Procedure: BYPASS GRAFT FEMORAL-TIBIAL ARTERY;  Surgeon: Juleen ChinaV Wells Brabham, MD;  Location: [facility] OR;  Service: Vascular;  Laterality: Right;  Procedure started @1607           Femoral/Anterior Tibial Bypass right .  Right Femoral Endarterectomy.  . Tibia im nail insertion  09/27/2011    Procedure: INTRAMEDULLARY (IM) NAIL TIBIAL;  Surgeon: Nadara MustardMarcus V Duda, MD;  Location: [facility] OR;  Service: Orthopedics;  Laterality: Right;  Procedure started @1510 -1536 in addition: Removal of Hardware Right Fibula  . Back surgery  1990's  . Amputation  10/18/2011    Procedure: AMPUTATION BELOW KNEE;  Surgeon: Nadara MustardMarcus V Duda, MD;  Location: [facility] OR;  Service: Orthopedics;  Laterality: Right;  Right Below Knee  Amputation    Social History History  Substance Use Topics  . Smoking status: Never Smoker   . Smokeless tobacco: Not on file  . Alcohol Use: Yes     Comment: DRINKS WINE    Family History Family History  Problem Relation Age of Onset  . Diabetes Other     Allergies  No Known Allergies   No current facility-administered medications for this encounter.    ROS:   General:  No weight loss, Fever, chills  HEENT: No recent headaches, no nasal bleeding, no visual changes, no sore throat  Neurologic: No dizziness, blackouts, seizures. No recent symptoms of stroke or mini- stroke. No recent episodes of slurred speech, or temporary blindness.  Cardiac: No recent episodes of chest pain/pressure, no shortness of breath at rest.  + shortness of breath with exertion.  Denies history of atrial fibrillation or irregular heartbeat  Vascular: No history of rest pain in feet.  No history of claudication.  No history of non-healing ulcer, No history of DVT   Pulmonary: No home oxygen, no productive cough, no hemoptysis,  No asthma or wheezing  Musculoskeletal:  [ ]  Arthritis, [ ]  Low back pain,  [ ]  Joint pain  Hematologic:No history of hypercoagulable state.  No history of easy bleeding.  No history of anemia  Gastrointestinal: No hematochezia  or melena,  No gastroesophageal reflux, no trouble swallowing  Urinary: [ ]  chronic Kidney disease, [ ]  on HD - [ ]  MWF or [ ]  TTHS, [ ]  Burning with urination, [ ]  Frequent urination, [ ]  Difficulty urinating;   Skin: No rashes  Psychological: No history of anxiety,  No history of depression   Physical Examination  Filed Vitals:   08/15/14 1900  BP: 157/82  Pulse: 109  TempSrc: Oral  Resp: 18  Height: 5\' 10"  (1.778 m)  Weight: 205 lb 0.4 oz (93 kg)  SpO2: 92%    Body mass index is 29.42 kg/(m^2).  General:  Alert and oriented, no acute distress HEENT: Normal Neck: No bruit or JVD Pulmonary: Clear to auscultation  bilaterally Cardiac: Regular Rate and Rhythm without murmur Abdomen: Soft, mildly tender in epigastrium easily distractable, no guarding or rebound, non-distended obese Skin: No rash Extremity Pulses:  2+ radial, brachial,2+ right femoral, left femoral difficult to palpate due to knee/hip contracture, absent left dorsalis pedis, posterior tibial pulses, right BKA Musculoskeletal: no edema, right BKA Neurologic: Upper and lower extremity motor 5/5 and symmetric  DATA:  CT from [facility] reviewed, solitary gallstone no wall edema or stranding, AAA 3 cm neck no stranding or wall edema or retroperitoneal edema   ASSESSMENT:  4.8 cm AAA doubt source of abdominal pain, possible biliary colic but also pain not really characterisitic   PLAN:  Will admit and observe for now.  NPO IV fluids.  Repeat lab work.  Fabienne Brunsharles Fields, MD [facility] [place] Office: [phone] Pager: [phone]

## 2014-08-15 NOTE — Progress Notes (Signed)
Pt admitted to unit form [facility] with Dx. ABD pain, 3A. Pt a/o x3 denies pain. Called Dr. Darrick PennaFields for orders.

## 2014-08-16 ENCOUNTER — Inpatient Hospital Stay (HOSPITAL_COMMUNITY): Payer: Medicare HMO

## 2014-08-16 ENCOUNTER — Encounter (HOSPITAL_COMMUNITY): Payer: Self-pay | Admitting: General Surgery

## 2014-08-16 DIAGNOSIS — I2510 Atherosclerotic heart disease of native coronary artery without angina pectoris: Secondary | ICD-10-CM

## 2014-08-16 DIAGNOSIS — Z01810 Encounter for preprocedural cardiovascular examination: Secondary | ICD-10-CM

## 2014-08-16 LAB — GLUCOSE, CAPILLARY
GLUCOSE-CAPILLARY: 136 mg/dL — AB (ref 70–99)
GLUCOSE-CAPILLARY: 136 mg/dL — AB (ref 70–99)
Glucose-Capillary: 106 mg/dL — ABNORMAL HIGH (ref 70–99)
Glucose-Capillary: 134 mg/dL — ABNORMAL HIGH (ref 70–99)
Glucose-Capillary: 136 mg/dL — ABNORMAL HIGH (ref 70–99)
Glucose-Capillary: 163 mg/dL — ABNORMAL HIGH (ref 70–99)
Glucose-Capillary: 175 mg/dL — ABNORMAL HIGH (ref 70–99)

## 2014-08-16 LAB — BASIC METABOLIC PANEL
ANION GAP: 11 (ref 5–15)
BUN: 16 mg/dL (ref 6–23)
CALCIUM: 9 mg/dL (ref 8.4–10.5)
CO2: 27 mEq/L (ref 19–32)
Chloride: 100 mEq/L (ref 96–112)
Creatinine, Ser: 1.52 mg/dL — ABNORMAL HIGH (ref 0.50–1.35)
GFR calc Af Amer: 49 mL/min — ABNORMAL LOW (ref >90)
GFR, EST NON AFRICAN AMERICAN: 42 mL/min — AB (ref >90)
Glucose, Bld: 135 mg/dL — ABNORMAL HIGH (ref 70–99)
Potassium: 4.8 mEq/L (ref 3.7–5.3)
Sodium: 138 mEq/L (ref 137–147)

## 2014-08-16 LAB — CBC
HCT: 40.6 % (ref 39.0–52.0)
Hemoglobin: 13.2 g/dL (ref 13.0–17.0)
MCH: 28.9 pg (ref 26.0–34.0)
MCHC: 32.5 g/dL (ref 30.0–36.0)
MCV: 89 fL (ref 78.0–100.0)
PLATELETS: 223 10*3/uL (ref 150–400)
RBC: 4.56 MIL/uL (ref 4.22–5.81)
RDW: 13.6 % (ref 11.5–15.5)
WBC: 12.6 10*3/uL — ABNORMAL HIGH (ref 4.0–10.5)

## 2014-08-16 LAB — SURGICAL PCR SCREEN
MRSA, PCR: NEGATIVE
Staphylococcus aureus: NEGATIVE

## 2014-08-16 LAB — AMYLASE: Amylase: 45 U/L (ref 0–105)

## 2014-08-16 LAB — LIPASE, BLOOD: Lipase: 30 U/L (ref 11–59)

## 2014-08-16 MED ORDER — CEFTRIAXONE SODIUM IN DEXTROSE 40 MG/ML IV SOLN
2.0000 g | INTRAVENOUS | Status: DC
  Administered 2014-08-16: 2 g via INTRAVENOUS
  Filled 2014-08-16 (×2): qty 50

## 2014-08-16 NOTE — Progress Notes (Addendum)
[facility] [place]  Subjective  - less pain   Objective 113/52 91 99.4 F (37.4 C) (Oral) 17 86% No intake or output data in the 24 hours ending 08/16/14 0803  Abdomen overall soft no guarding, no back pain  Assessment/Planning: Probably not AAA Will keep NPO while still having pain Will get RUQ ultrasound today to further define biliary system  FIELDS,CHARLES E 08/16/2014 8:03 AM -- Addendum: Ultrasound shows multiple stones and wall thickening.  Will have General Surgery (Dr Andrey CampanileWilson) eval for cholecystitis as possible cause of abdominal pain.  Fabienne Brunsharles Fields, MD [facility] [place] Office: [phone] Pager: [phone]  Laboratory Lab Results:  Recent Labs  08/15/14 2133 08/16/14 0506  WBC 13.2* 12.6*  HGB 13.7 13.2  HCT 41.4 40.6  PLT 224 223   BMET  Recent Labs  08/15/14 2133 08/16/14 0506  NA 140 138  K 4.2 4.8  CL 100 100  CO2 27 27  GLUCOSE 123* 135*  BUN 16 16  CREATININE 1.41* 1.52*  CALCIUM 9.1 9.0    COAG Lab Results  Component Value Date   INR 0.99 08/15/2014   INR 1.94* 10/20/2011   INR 1.10 10/19/2011   No results found for: PTT

## 2014-08-16 NOTE — Consult Note (Signed)
[facility] Surgery Consult Note  [name] 12/07/1934  [id number].    Requesting MD: Dr. Oneida Alar Chief Complaint/Reason for Consult: Cholecystitis  HPI:  79 y/o male with PMH CAD MI in 2012 s/p stent placement (Dr. Johnsie Cancel), DM, HLD, and AAA presented to [facility] with diffuse severe mid abdominal pain which started on 08/15/14 around 4am.  The pain has been constant and is only helped with IV pain medication.  Noted having significant nausea on Sunday, but that was replaced with sharp 10/10 pain.  He first describes his pain as being lower, but upon further questioning it is in the RUQ/epigastrium more than lower.  He was found to have a large gallstone stuck in the neck of the gallbladder.  He also was found to have a 4.8cm non-ruptured AAA.  He was transferred to [facility] and admitted to Dr. Oneida Alar service for further workup of his AAA.  He's been having normal BM's and flatus.  No CP/SOB, vomiting, fever/chills, no acholic stools or dark urine, no jaundice of skin/eyes.  Pain does not radiate.  No alleviating/aggrevating factors.  Only abdominal surgery was appendectomy when he was in high school.  ROS: All systems reviewed and otherwise negative except for as above  Family History  Problem Relation Age of Onset  . Diabetes Other     Past Medical History  Diagnosis Date  . Diabetes mellitus   . Hypercholesteremia   . Coronary artery disease 2012    Stent placed    Past Surgical History  Procedure Laterality Date  . Spine surgery    . Fracture surgery  08-2011    ORIF right ankle  . Femoral-tibial bypass graft  09/27/2011    Procedure: BYPASS GRAFT FEMORAL-TIBIAL ARTERY;  Surgeon: Theotis Burrow, MD;  Location: [place] OR;  Service: Vascular;  Laterality: Right;  Procedure started @1607           Femoral/Anterior Tibial Bypass right .  Right Femoral Endarterectomy.  . Tibia im nail insertion  09/27/2011    Procedure: INTRAMEDULLARY (IM) NAIL TIBIAL;  Surgeon: Newt Minion,  MD;  Location: [place];  Service: Orthopedics;  Laterality: Right;  Procedure started @1510 -1536 in addition: Removal of Hardware Right Fibula  . Back surgery  1990's  . Amputation  10/18/2011    Procedure: AMPUTATION BELOW KNEE;  Surgeon: Newt Minion, MD;  Location: [place];  Service: Orthopedics;  Laterality: Right;  Right Below Knee Amputation    Social History:  reports that he has never smoked. He does not have any smokeless tobacco history on file. He reports that he drinks alcohol. He reports that he does not use illicit drugs.  Allergies: No Known Allergies  Medications Prior to Admission  Medication Sig Dispense Refill  . aspirin EC 81 MG tablet Take 81 mg by mouth every evening.      . clopidogrel (PLAVIX) 75 MG tablet Take 75 mg by mouth daily.    . fenofibrate 160 MG tablet Take 160 mg by mouth daily.    . insulin glargine (LANTUS) 100 UNIT/ML injection Inject 40 Units into the skin at bedtime.      . metoprolol succinate (TOPROL-XL) 25 MG 24 hr tablet Take 25 mg by mouth 2 (two) times daily.    [name] NOVOLIN R RELION 100 UNIT/ML injection 25 Units 2 (two) times daily before a meal.    . simvastatin (ZOCOR) 40 MG tablet Take 40 mg by mouth at bedtime.        Blood  pressure 125/46, pulse 82, temperature 98.9 F (37.2 C), temperature source Oral, resp. rate 18, height 5' 10"  (1.778 m), weight 205 lb 0.4 oz (93 kg), SpO2 90 %. Physical Exam: General: pleasant, WD/WN white male who is laying in bed in NAD HEENT: head is normocephalic, atraumatic.  Sclera are noninjected, non-icteric.  PERRL.  Ears and nose without any masses or lesions.  Mouth is pink and moist Heart: regular, rate, and rhythm.  No obvious murmurs, gallops, or rubs noted.  Palpable pedal pulses bilaterally Lungs: CTAB, no wheezes, rhonchi, or rales noted.  Respiratory effort nonlabored Abd: Obese, soft, tender in RUQ and epigastrium, mildly distended, +BS, no masses, hernias, or organomegaly, appendectomy scar in RLQ  (open) MS: Right BKA, left leg CSM intact, no tenderness to the LE's, UE normal with good pulses Skin: warm and dry with no masses, lesions, or rashes Psych: A&Ox3 with an appropriate affect.   Results for orders placed or performed during the hospital encounter of 08/15/14 (from the past 48 hour(s))  Glucose, capillary     Status: Abnormal   Collection Time: 08/15/14  6:20 PM  Result Value Ref Range   Glucose-Capillary 136 (H) 70 - 99 mg/dL   Comment 1 Orig [place] entered as [id number]   CBC     Status: Abnormal   Collection Time: 08/15/14  9:33 PM  Result Value Ref Range   WBC 13.2 (H) 4.0 - 10.5 K/uL   RBC 4.59 4.22 - 5.81 MIL/uL   Hemoglobin 13.7 13.0 - 17.0 g/dL   HCT 41.4 39.0 - 52.0 %   MCV 90.2 78.0 - 100.0 fL   MCH 29.8 26.0 - 34.0 pg   MCHC 33.1 30.0 - 36.0 g/dL   RDW 13.4 11.5 - 15.5 %   Platelets 224 150 - 400 K/uL  Comprehensive metabolic panel     Status: Abnormal   Collection Time: 08/15/14  9:33 PM  Result Value Ref Range   Sodium 140 137 - 147 mEq/L   Potassium 4.2 3.7 - 5.3 mEq/L   Chloride 100 96 - 112 mEq/L   CO2 27 19 - 32 mEq/L   Glucose, Bld 123 (H) 70 - 99 mg/dL   BUN 16 6 - 23 mg/dL   Creatinine, Ser 1.41 (H) 0.50 - 1.35 mg/dL   Calcium 9.1 8.4 - 10.5 mg/dL   Total Protein 7.1 6.0 - 8.3 g/dL   Albumin 3.4 (L) 3.5 - 5.2 g/dL   AST 19 0 - 37 U/L   ALT 16 0 - 53 U/L   Alkaline Phosphatase 46 39 - 117 U/L   Total Bilirubin 0.3 0.3 - 1.2 mg/dL   GFR calc non Af Amer 47 (L) >90 mL/min   GFR calc Af Amer 54 (L) >90 mL/min    Comment: (NOTE) The eGFR has been calculated using the CKD EPI equation. This calculation has not been validated in all clinical situations. eGFR's persistently <90 mL/min signify possible Chronic Kidney Disease.    Anion gap 13 5 - 15  Protime-INR     Status: None   Collection Time: 08/15/14  9:33 PM  Result Value Ref Range   Prothrombin Time 13.2 11.6 - 15.2 seconds   INR 0.99 0.00 - 1.49  Glucose, capillary     Status:  Abnormal   Collection Time: 08/15/14  9:53 PM  Result Value Ref Range   Glucose-Capillary 120 (H) 70 - 99 mg/dL   Comment 1 Documented in Chart    Comment 2 Notify  RN   Urinalysis, Routine w reflex microscopic     Status: Abnormal   Collection Time: 08/15/14 10:24 PM  Result Value Ref Range   Color, Urine YELLOW YELLOW   APPearance CLEAR CLEAR   Specific Gravity, Urine >1.046 (H) 1.005 - 1.030   pH 6.0 5.0 - 8.0   Glucose, UA 250 (A) NEGATIVE mg/dL   Hgb urine dipstick NEGATIVE NEGATIVE   Bilirubin Urine NEGATIVE NEGATIVE   Ketones, ur NEGATIVE NEGATIVE mg/dL   Protein, ur NEGATIVE NEGATIVE mg/dL   Urobilinogen, UA 0.2 0.0 - 1.0 mg/dL   Nitrite NEGATIVE NEGATIVE   Leukocytes, UA NEGATIVE NEGATIVE    Comment: MICROSCOPIC NOT DONE ON URINES WITH NEGATIVE PROTEIN, BLOOD, LEUKOCYTES, NITRITE, OR GLUCOSE <1000 mg/dL.  Glucose, capillary     Status: Abnormal   Collection Time: 08/16/14 12:41 AM  Result Value Ref Range   Glucose-Capillary 106 (H) 70 - 99 mg/dL  Glucose, capillary     Status: Abnormal   Collection Time: 08/16/14  4:06 AM  Result Value Ref Range   Glucose-Capillary 136 (H) 70 - 99 mg/dL  CBC     Status: Abnormal   Collection Time: 08/16/14  5:06 AM  Result Value Ref Range   WBC 12.6 (H) 4.0 - 10.5 K/uL   RBC 4.56 4.22 - 5.81 MIL/uL   Hemoglobin 13.2 13.0 - 17.0 g/dL   HCT 40.6 39.0 - 52.0 %   MCV 89.0 78.0 - 100.0 fL   MCH 28.9 26.0 - 34.0 pg   MCHC 32.5 30.0 - 36.0 g/dL   RDW 13.6 11.5 - 15.5 %   Platelets 223 150 - 400 K/uL  Basic metabolic panel     Status: Abnormal   Collection Time: 08/16/14  5:06 AM  Result Value Ref Range   Sodium 138 137 - 147 mEq/L   Potassium 4.8 3.7 - 5.3 mEq/L   Chloride 100 96 - 112 mEq/L   CO2 27 19 - 32 mEq/L   Glucose, Bld 135 (H) 70 - 99 mg/dL   BUN 16 6 - 23 mg/dL   Creatinine, Ser 1.52 (H) 0.50 - 1.35 mg/dL   Calcium 9.0 8.4 - 10.5 mg/dL   GFR calc non Af Amer 42 (L) >90 mL/min   GFR calc Af Amer 49 (L) >90 mL/min     Comment: (NOTE) The eGFR has been calculated using the CKD EPI equation. This calculation has not been validated in all clinical situations. eGFR's persistently <90 mL/min signify possible Chronic Kidney Disease.    Anion gap 11 5 - 15  Amylase     Status: None   Collection Time: 08/16/14  5:06 AM  Result Value Ref Range   Amylase 45 0 - 105 U/L  Lipase, blood     Status: None   Collection Time: 08/16/14  5:06 AM  Result Value Ref Range   Lipase 30 11 - 59 U/L  Glucose, capillary     Status: Abnormal   Collection Time: 08/16/14  8:16 AM  Result Value Ref Range   Glucose-Capillary 136 (H) 70 - 99 mg/dL   Comment 1 Notify RN   Glucose, capillary     Status: Abnormal   Collection Time: 08/16/14 11:43 AM  Result Value Ref Range   Glucose-Capillary 163 (H) 70 - 99 mg/dL   Comment 1 Notify RN    US Abdomen Complete  08/16/2014   CLINICAL DATA:  Acute epigastric pain.  EXAM: ULTRASOUND ABDOMEN COMPLETE  COMPARISON:  Acute abdominal series 11  08/15/2014 and CT abdomen and pelvis 02/24/2006  FINDINGS: Gallbladder: Cholelithiasis is present. There is an approximately 2.5 cm stone in the region of the gallbladder neck. There are additional smaller stones. Gallbladder sludge is present. The gallbladder wall is thickened and contains some small echogenic foci with associated ring down artifact. Gallbladder wall thickening measures up to 8.8 mm. The sonographic Percell Miller sign is negative. No pericholecystic fluid is appreciated.  Common bile duct: Diameter: 4 mm  Liver: Echotexture of the liver appears increased and slightly heterogeneous. It is difficult to completely penetrate the liver with ultrasound being. Findings suggest fatty infiltration the liver. No definite focal hepatic mass is identified by ultrasound.  IVC: Not well visualized  Pancreas: Not well visualized  Spleen: Size and appearance within normal limits.  Right Kidney: Length: 10.5 cm. Echogenicity within normal limits. No mass or  hydronephrosis visualized.  Left Kidney: Length: 11.5 cm. Echogenicity within normal limits. No mass or hydronephrosis visualized.  Abdominal aorta: An infrarenal abdominal aortic aneurysm is visualized, measuring up to 4.9 cm AP diameter and 6.4 cm and craniocaudal span.  Other findings: None.  IMPRESSION: 1. Cholelithiasis. Gallstones are present, including a 2.5 cm stone in the gallbladder neck. Gallbladder wall thickening is present, measuring up to 8.8 mm. Echogenic foci within the gallbladder wall with associated ring down artifact suggest adenomyomatosis of the gallbladder, which can be a cause of chronic wall thickening. In the setting of acute pain, the possibility of acute cholecystitis as a cause of wall thickening cannot be excluded. The sonographer states that the patient has a negative sonographic Murphy's sign at this time. 2. Infrarenal abdominal aortic aneurysm. This aneurysm appears increased in size since the CT of 02/24/2006. Please also see the CT chest abdomen pelvis performed yesterday, 08/15/2014. 3. Probable fatty infiltration of the liver. 4. Essentially nonvisualization of the inferior vena cava and pancreas. These results will be called to the ordering clinician or representative by the Radiologist Assistant, and communication documented in the PACS or zVision Dashboard.   Electronically Signed   By: Curlene Dolphin M.D.   On: 08/16/2014 11:05      Assessment/Plan Cholecystitis with choledocholithiasis (stone in neck) CAD s/p stent in 2012 (Dr. Johnsie Cancel) DM on insulin HLD AAA at 4.8cm - non-ruptured  Plan: 1.  Likely needs lap chole given symptoms, images.  LFT's are normal, WBC is elevated at 12.6.  If Dr. Oneida Alar feels as though he needs nothing done for his AAA and cardiology clears him we can likely proceed with lap chole as early as tomorrow.  Dr. Redmond Pulling to see and evaluate and decide on timing.   2.  NPO, bowel rest, IVF, pain control, antiemetics, antibiotics (rocephin Day  #1) 3.  SCD's and hold dvt proph after MN, continue to hold plavix.  Last plavix/aspirin dose was on Sunday.  4.  Ambulate and IS as able 5.  Discussed procedure with the patient and his wife and he would like to proceed.   Coralie Keens, [facility] Surgery 08/16/2014, 2:53 PM Pager: [phone]

## 2014-08-16 NOTE — Plan of Care (Signed)
Problem: Consults Goal: Skin Care Protocol Initiated - if Braden Score 18 or less If consults are not indicated, leave blank or document N/A Outcome: Not Applicable Date Met:  41/99/14

## 2014-08-16 NOTE — Progress Notes (Signed)
Patient ID: Kevin DumasDavid L Sharp MRN: [id number] DOB/AGE: [birth year] 77 y.o.  Admit date: 08/15/2014 Primary Cardiologist: Eden EmmsNishan Reason for Consultation: Pre-operative risk assessment  HPI: 79 yo male with history of CAD s/[place] in [place], DM, HLD, PAD s/p right BKA, AAA admitted by the Vascular surgery service with abdominal pain, slight enlargement of AAA. He has since had an u/s of the abdomen suggesting acute cholecystitis. He has been seen by the surgery service and plans are being made for cholecystectomy.  His AAA is felt to be overall stable and not contributing to his presentation. Cardiology consulted for pre-operative risk assessment.   He tells me that he has been doing well at home. No chest pain or SOB. He is limited somewhat by his prosthetic leg. No dizziness, near syncope or syncope. No left lower ext edema. He does endorse abdominal pain. No radiation. No fever, chills, sweats. No neurological complaints.    Past Medical History  Diagnosis Date  . Diabetes mellitus   . Hypercholesteremia   . Coronary artery disease 2012    [place]  . AAA (abdominal aortic aneurysm)   . PAD (peripheral artery disease)     s/p right BKA    Family History  Problem Relation Age of Onset  . Diabetes Other   . Coronary artery disease Neg Hx     History   Social History  . Marital Status: Married    Spouse Name: N/A    Number of Children: N/A  . Years of Education: N/A   Occupational History  . Retired    Social History Main Topics  . Smoking status: Never Smoker   . Smokeless tobacco: Not on file  . Alcohol Use: Yes     Comment: DRINKS WINE  . Drug Use: No  . Sexual Activity: Not on file   Other Topics Concern  . Not on file   Social History Narrative    Past Surgical History  Procedure Laterality Date  . Spine surgery    . Fracture surgery  08-2011    ORIF right ankle  . Femoral-tibial bypass graft  09/27/2011    Procedure:  BYPASS GRAFT FEMORAL-TIBIAL ARTERY;  Surgeon: Juleen ChinaV Wells Brabham, MD;  Location: [facility] OR;  Service: Vascular;  Laterality: Right;  Procedure started @1607           Femoral/Anterior Tibial Bypass right .  Right Femoral Endarterectomy.  . Tibia im nail insertion  09/27/2011    Procedure: INTRAMEDULLARY (IM) NAIL TIBIAL;  Surgeon: Nadara MustardMarcus V Duda, MD;  Location: [facility] OR;  Service: Orthopedics;  Laterality: Right;  Procedure started @1510 -1536 in addition: Removal of Hardware Right Fibula  . Back surgery  1990's  . Amputation  10/18/2011    Procedure: AMPUTATION BELOW KNEE;  Surgeon: Nadara MustardMarcus V Duda, MD;  Location: [facility] OR;  Service: Orthopedics;  Laterality: Right;  Right Below Knee Amputation  . Appendectomy      Open - high school    No Known Allergies  Prior to Admission medications   Medication Sig Start Date End Date Taking? Authorizing Provider  aspirin EC 81 MG tablet Take 81 mg by mouth every evening.     Yes Historical Provider, MD  clopidogrel (PLAVIX) 75 MG tablet Take 75 mg by mouth daily.   Yes Historical Provider, MD  fenofibrate 160 MG tablet Take 160 mg by mouth daily.   Yes Historical Provider, MD  insulin glargine (LANTUS) 100 UNIT/ML injection  Inject 40 Units into the skin at bedtime.     Yes Historical Provider, MD  metoprolol succinate (TOPROL-XL) 25 MG 24 hr tablet Take 25 mg by mouth 2 (two) times daily. 04/05/13  Yes Historical Provider, MD  NOVOLIN R RELION 100 UNIT/ML injection 25 Units 2 (two) times daily before a meal. 07/01/14  Yes Historical Provider, MD  simvastatin (ZOCOR) 40 MG tablet Take 40 mg by mouth at bedtime.     Yes Historical Provider, MD    Review of systems complete and found to be negative unless listed above   Physical Exam: Blood pressure 125/46, pulse 82, temperature 98.9 F (37.2 C), temperature source Oral, resp. rate 18, height 5\' 10"  (1.778 m), weight 205 lb 0.4 oz (93 kg), SpO2 90 %.    General: Well developed, well nourished, NAD  HEENT: OP clear, mucus  membranes moist  SKIN: warm, dry. No rashes.  Neuro: No focal deficits  Musculoskeletal: Muscle strength 5/5 all ext  Psychiatric: Mood and affect normal  Neck: No JVD, no carotid bruits, no thyromegaly, no lymphadenopathy.  Lungs:Clear bilaterally, no wheezes, rhonci, crackles  Cardiovascular: Regular rate and rhythm. No murmurs, gallops or rubs.  Abdomen:Soft. Bowel sounds present. TTP diffusely Extremities: No left lower extremity edema. Right BKA   Labs:   Lab Results  Component Value Date   WBC 12.6* 08/16/2014   HGB 13.2 08/16/2014   HCT 40.6 08/16/2014   MCV 89.0 08/16/2014   PLT 223 08/16/2014     Recent Labs Lab 08/15/14 2133 08/16/14 0506  NA 140 138  K 4.2 4.8  CL 100 100  CO2 27 27  BUN 16 16  CREATININE 1.41* 1.52*  CALCIUM 9.1 9.0  PROT 7.1  --   BILITOT 0.3  --   ALKPHOS 46  --   ALT 16  --   AST 19  --   GLUCOSE 123* 135*      Abdominal u/s: 08/16/14: IMPRESSION: 1. Cholelithiasis. Gallstones are present, including a 2.5 cm stone in the gallbladder neck. Gallbladder wall thickening is present, measuring up to 8.8 mm. Echogenic foci within the gallbladder wall with associated ring down artifact suggest adenomyomatosis of the gallbladder, which can be a cause of chronic wall thickening. In the setting of acute pain, the possibility of acute cholecystitis as a cause of wall thickening cannot be excluded. The sonographer states that the patient has a negative sonographic Murphy's sign at this time. 2. Infrarenal abdominal aortic aneurysm. This aneurysm appears increased in size since the CT of 02/24/2006. Please also see the CT chest abdomen pelvis performed yesterday, 08/15/2014. 3. Probable fatty infiltration of the liver. 4. Essentially nonvisualization of the inferior vena cava and pancreas. These results will be called to the ordering clinician or representative by the Radiologist Assistant, and communication documented in the PACS or  zVision Dashboard.  EKG: 08/15/14: [facility]: NSR, rate 69 bpm.RBBB. LAFB. Non-specific T wave abnormality (unchanged from August 2014)  ASSESSMENT AND PLAN: 79 yo male with history of CAD, PAD, AAA, HLD, DM admitted with abdominal pain and found to have acute cholecystitis. Cardiology consulted for pre-operative risk assessment. Pt has been on long term Plavix.   1. CAD/Pre-operative risk assessment: Stable. He is on ASA, Plavix, beta blocker and statin. He has no signs or symptoms of unstable angina, CHF or arrythmias. He does not need further cardiac workup before his planned surgical procedure. His Plavix has been held since admission on 08/15/14. This can be done safely as  he has had no recent stenting procedures. The presence of Plavix may affect the timing of his surgical procedure.    Signed: Verne Carrowhristopher Olegario Emberson, MD 08/16/2014, 3:45 PM

## 2014-08-17 ENCOUNTER — Encounter (HOSPITAL_COMMUNITY)
Admission: AD | Disposition: A | Discharge status: Home / Self Care | Payer: Self-pay | Source: Other Acute Inpatient Hospital

## 2014-08-17 ENCOUNTER — Inpatient Hospital Stay (HOSPITAL_COMMUNITY): Payer: Medicare HMO | Admitting: Certified Registered"

## 2014-08-17 ENCOUNTER — Encounter (HOSPITAL_COMMUNITY): Payer: Self-pay | Admitting: General Practice

## 2014-08-17 ENCOUNTER — Inpatient Hospital Stay (HOSPITAL_COMMUNITY): Payer: Medicare HMO

## 2014-08-17 HISTORY — PX: CHOLECYSTECTOMY: SHX55

## 2014-08-17 LAB — GLUCOSE, CAPILLARY
GLUCOSE-CAPILLARY: 175 mg/dL — AB (ref 70–99)
Glucose-Capillary: 142 mg/dL — ABNORMAL HIGH (ref 70–99)
Glucose-Capillary: 149 mg/dL — ABNORMAL HIGH (ref 70–99)
Glucose-Capillary: 160 mg/dL — ABNORMAL HIGH (ref 70–99)
Glucose-Capillary: 160 mg/dL — ABNORMAL HIGH (ref 70–99)
Glucose-Capillary: 167 mg/dL — ABNORMAL HIGH (ref 70–99)

## 2014-08-17 LAB — CBC WITH DIFFERENTIAL/PLATELET
BASOS ABS: 0 10*3/uL (ref 0.0–0.1)
BASOS PCT: 0 % (ref 0–1)
Eosinophils Absolute: 0.3 10*3/uL (ref 0.0–0.7)
Eosinophils Relative: 3 % (ref 0–5)
HCT: 36.9 % — ABNORMAL LOW (ref 39.0–52.0)
Hemoglobin: 12.2 g/dL — ABNORMAL LOW (ref 13.0–17.0)
Lymphocytes Relative: 31 % (ref 12–46)
Lymphs Abs: 3.4 10*3/uL (ref 0.7–4.0)
MCH: 29.8 pg (ref 26.0–34.0)
MCHC: 33.1 g/dL (ref 30.0–36.0)
MCV: 90 fL (ref 78.0–100.0)
Monocytes Absolute: 0.9 10*3/uL (ref 0.1–1.0)
Monocytes Relative: 8 % (ref 3–12)
NEUTROS PCT: 58 % (ref 43–77)
Neutro Abs: 6.5 10*3/uL (ref 1.7–7.7)
PLATELETS: 207 10*3/uL (ref 150–400)
RBC: 4.1 MIL/uL — AB (ref 4.22–5.81)
RDW: 13.3 % (ref 11.5–15.5)
WBC: 11.1 10*3/uL — ABNORMAL HIGH (ref 4.0–10.5)

## 2014-08-17 LAB — COMPREHENSIVE METABOLIC PANEL
ALBUMIN: 2.9 g/dL — AB (ref 3.5–5.2)
ALT: 12 U/L (ref 0–53)
ANION GAP: 13 (ref 5–15)
AST: 16 U/L (ref 0–37)
Alkaline Phosphatase: 38 U/L — ABNORMAL LOW (ref 39–117)
BILIRUBIN TOTAL: 0.5 mg/dL (ref 0.3–1.2)
BUN: 18 mg/dL (ref 6–23)
CHLORIDE: 99 meq/L (ref 96–112)
CO2: 25 mEq/L (ref 19–32)
CREATININE: 1.68 mg/dL — AB (ref 0.50–1.35)
Calcium: 8.6 mg/dL (ref 8.4–10.5)
GFR, EST AFRICAN AMERICAN: 44 mL/min — AB (ref >90)
GFR, EST NON AFRICAN AMERICAN: 38 mL/min — AB (ref >90)
Glucose, Bld: 140 mg/dL — ABNORMAL HIGH (ref 70–99)
Potassium: 3.9 mEq/L (ref 3.7–5.3)
Sodium: 137 mEq/L (ref 137–147)
Total Protein: 6.4 g/dL (ref 6.0–8.3)

## 2014-08-17 SURGERY — LAPAROSCOPIC CHOLECYSTECTOMY WITH INTRAOPERATIVE CHOLANGIOGRAM
Anesthesia: General | Site: Abdomen

## 2014-08-17 MED ORDER — PROPOFOL 10 MG/ML IV BOLUS
INTRAVENOUS | Status: AC
  Filled 2014-08-17: qty 20

## 2014-08-17 MED ORDER — STERILE WATER FOR INJECTION IJ SOLN
INTRAMUSCULAR | Status: AC
  Filled 2014-08-17: qty 10

## 2014-08-17 MED ORDER — SODIUM CHLORIDE 0.9 % IJ SOLN
INTRAMUSCULAR | Status: AC
  Filled 2014-08-17: qty 9

## 2014-08-17 MED ORDER — FENTANYL CITRATE 0.05 MG/ML IJ SOLN
INTRAMUSCULAR | Status: DC | PRN
  Administered 2014-08-17: 50 ug via INTRAVENOUS
  Administered 2014-08-17: 100 ug via INTRAVENOUS
  Administered 2014-08-17 (×2): 50 ug via INTRAVENOUS

## 2014-08-17 MED ORDER — HYDROMORPHONE HCL 1 MG/ML IJ SOLN
0.2500 mg | INTRAMUSCULAR | Status: DC | PRN
  Administered 2014-08-17 (×2): 0.25 mg via INTRAVENOUS

## 2014-08-17 MED ORDER — KETOROLAC TROMETHAMINE 30 MG/ML IJ SOLN
INTRAMUSCULAR | Status: AC
  Filled 2014-08-17: qty 1

## 2014-08-17 MED ORDER — FENTANYL CITRATE 0.05 MG/ML IJ SOLN
INTRAMUSCULAR | Status: AC
  Filled 2014-08-17: qty 5

## 2014-08-17 MED ORDER — LIDOCAINE HCL (CARDIAC) 20 MG/ML IV SOLN
INTRAVENOUS | Status: DC | PRN
  Administered 2014-08-17: 100 mg via INTRAVENOUS

## 2014-08-17 MED ORDER — VECURONIUM BROMIDE 10 MG IV SOLR
INTRAVENOUS | Status: DC | PRN
  Administered 2014-08-17: 1 mg via INTRAVENOUS
  Administered 2014-08-17 (×2): 2 mg via INTRAVENOUS

## 2014-08-17 MED ORDER — PROMETHAZINE HCL 25 MG/ML IJ SOLN
6.2500 mg | INTRAMUSCULAR | Status: DC | PRN
  Administered 2014-08-17: 6.25 mg via INTRAVENOUS

## 2014-08-17 MED ORDER — EPHEDRINE SULFATE 50 MG/ML IJ SOLN
INTRAMUSCULAR | Status: DC | PRN
  Administered 2014-08-17: 5 mg via INTRAVENOUS

## 2014-08-17 MED ORDER — PROPOFOL 10 MG/ML IV BOLUS
INTRAVENOUS | Status: DC | PRN
  Administered 2014-08-17: 120 mg via INTRAVENOUS

## 2014-08-17 MED ORDER — CEFTRIAXONE SODIUM IN DEXTROSE 40 MG/ML IV SOLN
2.0000 g | INTRAVENOUS | Status: AC
  Administered 2014-08-17: 2 g via INTRAVENOUS
  Filled 2014-08-17: qty 50

## 2014-08-17 MED ORDER — GLYCOPYRROLATE 0.2 MG/ML IJ SOLN
INTRAMUSCULAR | Status: AC
  Filled 2014-08-17: qty 2

## 2014-08-17 MED ORDER — HYDROMORPHONE HCL 1 MG/ML IJ SOLN: 0.2500 mg | INTRAMUSCULAR | Status: DC | PRN

## 2014-08-17 MED ORDER — NEOSTIGMINE METHYLSULFATE 10 MG/10ML IV SOLN
INTRAVENOUS | Status: DC | PRN
  Administered 2014-08-17: 3 mg via INTRAVENOUS

## 2014-08-17 MED ORDER — SODIUM CHLORIDE 0.9 % IR SOLN
Status: DC | PRN
  Administered 2014-08-17: 1000 mL

## 2014-08-17 MED ORDER — GLYCOPYRROLATE 0.2 MG/ML IJ SOLN
INTRAMUSCULAR | Status: DC | PRN
  Administered 2014-08-17: .4 mg via INTRAVENOUS

## 2014-08-17 MED ORDER — OXYCODONE-ACETAMINOPHEN 5-325 MG PO TABS
1.0000 | ORAL_TABLET | ORAL | Status: DC | PRN
  Administered 2014-08-18: 1 via ORAL
  Filled 2014-08-17: qty 1

## 2014-08-17 MED ORDER — EPHEDRINE SULFATE 50 MG/ML IJ SOLN
INTRAMUSCULAR | Status: AC
  Filled 2014-08-17: qty 1

## 2014-08-17 MED ORDER — SODIUM CHLORIDE 0.9 % IV SOLN
INTRAVENOUS | Status: DC | PRN
  Administered 2014-08-17: 10 mL

## 2014-08-17 MED ORDER — PHENYLEPHRINE HCL 10 MG/ML IJ SOLN
INTRAMUSCULAR | Status: DC | PRN
  Administered 2014-08-17 (×2): 80 ug via INTRAVENOUS
  Administered 2014-08-17 (×2): 120 ug via INTRAVENOUS

## 2014-08-17 MED ORDER — PROMETHAZINE HCL 25 MG/ML IJ SOLN
INTRAMUSCULAR | Status: AC
  Filled 2014-08-17: qty 1

## 2014-08-17 MED ORDER — 0.9 % SODIUM CHLORIDE (POUR BTL) OPTIME
TOPICAL | Status: DC | PRN
  Administered 2014-08-17: 1000 mL

## 2014-08-17 MED ORDER — ENOXAPARIN SODIUM 40 MG/0.4ML ~~LOC~~ SOLN
40.0000 mg | SUBCUTANEOUS | Status: DC
  Administered 2014-08-18 – 2014-08-19 (×2): 40 mg via SUBCUTANEOUS
  Filled 2014-08-17 (×3): qty 0.4

## 2014-08-17 MED ORDER — BUPIVACAINE-EPINEPHRINE 0.25% -1:200000 IJ SOLN
INTRAMUSCULAR | Status: DC | PRN
  Administered 2014-08-17: 30 mL

## 2014-08-17 MED ORDER — SUCCINYLCHOLINE CHLORIDE 20 MG/ML IJ SOLN
INTRAMUSCULAR | Status: DC | PRN
  Administered 2014-08-17: 60 mg via INTRAVENOUS

## 2014-08-17 MED ORDER — BUPIVACAINE-EPINEPHRINE (PF) 0.25% -1:200000 IJ SOLN
INTRAMUSCULAR | Status: AC
  Filled 2014-08-17: qty 30

## 2014-08-17 MED ORDER — ONDANSETRON HCL 4 MG/2ML IJ SOLN
INTRAMUSCULAR | Status: DC | PRN
  Administered 2014-08-17: 4 mg via INTRAVENOUS

## 2014-08-17 MED ORDER — LACTATED RINGERS IV SOLN
INTRAVENOUS | Status: DC
  Administered 2014-08-17 (×2): via INTRAVENOUS

## 2014-08-17 MED ORDER — HYDROMORPHONE HCL 1 MG/ML IJ SOLN
INTRAMUSCULAR | Status: AC
  Filled 2014-08-17: qty 1

## 2014-08-17 SURGICAL SUPPLY — 50 items
APPLIER CLIP 5 13 M/L LIGAMAX5 (MISCELLANEOUS) ×3
BANDAGE ADH SHEER 1  50/CT (GAUZE/BANDAGES/DRESSINGS) ×9 IMPLANT
BENZOIN TINCTURE PRP APPL 2/3 (GAUZE/BANDAGES/DRESSINGS) ×3 IMPLANT
BLADE SURG ROTATE 9660 (MISCELLANEOUS) ×3 IMPLANT
CANISTER SUCTION 2500CC (MISCELLANEOUS) ×3 IMPLANT
CHLORAPREP W/TINT 26ML (MISCELLANEOUS) ×3 IMPLANT
CLIP APPLIE 5 13 M/L LIGAMAX5 (MISCELLANEOUS) ×1 IMPLANT
CLOSURE WOUND 1/2 X4 (GAUZE/BANDAGES/DRESSINGS) ×1
COVER MAYO STAND STRL (DRAPES) ×3 IMPLANT
COVER SURGICAL LIGHT HANDLE (MISCELLANEOUS) ×3 IMPLANT
DEVICE TROCAR PUNCTURE CLOSURE (ENDOMECHANICALS) ×3 IMPLANT
DRAPE C-ARM 42X72 X-RAY (DRAPES) ×3 IMPLANT
DRAPE LAPAROSCOPIC ABDOMINAL (DRAPES) ×3 IMPLANT
DRSG TEGADERM 4X4.75 (GAUZE/BANDAGES/DRESSINGS) IMPLANT
ELECT REM PT RETURN 9FT ADLT (ELECTROSURGICAL) ×3
ELECTRODE REM PT RTRN 9FT ADLT (ELECTROSURGICAL) ×1 IMPLANT
GAUZE SPONGE 2X2 8PLY STRL LF (GAUZE/BANDAGES/DRESSINGS) IMPLANT
GLOVE BIOGEL M STRL SZ7.5 (GLOVE) ×3 IMPLANT
GLOVE BIOGEL PI IND STRL 7.5 (GLOVE) ×1 IMPLANT
GLOVE BIOGEL PI IND STRL 8 (GLOVE) ×1 IMPLANT
GLOVE BIOGEL PI INDICATOR 7.5 (GLOVE) ×2
GLOVE BIOGEL PI INDICATOR 8 (GLOVE) ×2
GLOVE BIOGEL PI ORTHO PRO SZ7 (GLOVE) ×2
GLOVE ECLIPSE 7.5 STRL STRAW (GLOVE) ×3 IMPLANT
GLOVE PI ORTHO PRO STRL SZ7 (GLOVE) ×1 IMPLANT
GLOVE SURG SS PI 6.5 STRL IVOR (GLOVE) ×3 IMPLANT
GOWN STRL REUS W/ TWL LRG LVL3 (GOWN DISPOSABLE) ×1 IMPLANT
GOWN STRL REUS W/ TWL XL LVL3 (GOWN DISPOSABLE) ×2 IMPLANT
GOWN STRL REUS W/TWL LRG LVL3 (GOWN DISPOSABLE) ×2
GOWN STRL REUS W/TWL XL LVL3 (GOWN DISPOSABLE) ×4
KIT BASIN OR (CUSTOM PROCEDURE TRAY) ×3 IMPLANT
KIT ROOM TURNOVER OR (KITS) ×3 IMPLANT
NS IRRIG 1000ML POUR BTL (IV SOLUTION) ×3 IMPLANT
PAD ARMBOARD 7.5X6 YLW CONV (MISCELLANEOUS) ×3 IMPLANT
POUCH SPECIMEN RETRIEVAL 10MM (ENDOMECHANICALS) ×3 IMPLANT
SCALPEL HARMONIC ACE (MISCELLANEOUS) ×3 IMPLANT
SCISSORS LAP 5X35 DISP (ENDOMECHANICALS) ×3 IMPLANT
SET CHOLANGIOGRAPH 5 50 .035 (SET/KITS/TRAYS/PACK) ×3 IMPLANT
SET IRRIG TUBING LAPAROSCOPIC (IRRIGATION / IRRIGATOR) ×3 IMPLANT
SLEEVE ENDOPATH XCEL 5M (ENDOMECHANICALS) ×6 IMPLANT
SPECIMEN JAR SMALL (MISCELLANEOUS) ×3 IMPLANT
SPONGE GAUZE 2X2 STER 10/PKG (GAUZE/BANDAGES/DRESSINGS)
STRIP CLOSURE SKIN 1/2X4 (GAUZE/BANDAGES/DRESSINGS) ×2 IMPLANT
SUT MNCRL AB 4-0 PS2 18 (SUTURE) ×3 IMPLANT
SUT VICRYL 0 UR6 27IN ABS (SUTURE) ×6 IMPLANT
TOWEL OR 17X26 10 PK STRL BLUE (TOWEL DISPOSABLE) ×3 IMPLANT
TRAY LAPAROSCOPIC (CUSTOM PROCEDURE TRAY) ×3 IMPLANT
TROCAR BLADELESS 11MM (ENDOMECHANICALS) ×3 IMPLANT
TROCAR XCEL NON-BLD 5MMX100MML (ENDOMECHANICALS) ×3 IMPLANT
TUBING INSUFFLATION (TUBING) ×3 IMPLANT

## 2014-08-17 NOTE — Anesthesia Preprocedure Evaluation (Addendum)
Anesthesia Evaluation  Patient identified by MRN, date of birth, ID band Patient awake    Reviewed: Allergy & Precautions, H&P , NPO status , Patient's Chart, lab work & pertinent test results  History of Anesthesia Complications Negative for: history of anesthetic complications  Airway Mallampati: II  TM Distance: >3 FB Neck ROM: Full    Dental  (+) Edentulous Upper, Edentulous Lower   Pulmonary former smoker,          Cardiovascular hypertension, Pt. on medications and Pt. on home beta blockers + CAD, + Cardiac Stents and + Peripheral Vascular Disease Rhythm:Regular     Neuro/Psych negative neurological ROS  negative psych ROS   GI/Hepatic negative GI ROS, Neg liver ROS,   Endo/Other  diabetes, Type 2, Insulin Dependent  Renal/GU      Musculoskeletal   Abdominal   Peds  Hematology negative hematology ROS (+)   Anesthesia Other Findings   Reproductive/Obstetrics                            Anesthesia Physical Anesthesia Plan  ASA: III  Anesthesia Plan: General   Post-op Pain Management:    Induction: Intravenous  Airway Management Planned: Oral ETT  Additional Equipment:   Intra-op Plan:   Post-operative Plan: Extubation in OR  Informed Consent: I have reviewed the patients History and Physical, chart, labs and discussed the procedure including the risks, benefits and alternatives for the proposed anesthesia with the patient or authorized representative who has indicated his/her understanding and acceptance.   Dental advisory given  Plan Discussed with: Anesthesiologist, Surgeon and CRNA  Anesthesia Plan Comments:        Anesthesia Quick Evaluation

## 2014-08-17 NOTE — Anesthesia Procedure Notes (Signed)
Procedure Name: Intubation Date/Time: 08/17/2014 10:59 AM Performed by: Arlice ColtMANESS, Chiann Goffredo B Pre-anesthesia Checklist: Patient identified, Emergency Drugs available, Suction available, Patient being monitored and Timeout performed Patient Re-evaluated:Patient Re-evaluated prior to inductionOxygen Delivery Method: Circle system utilized Preoxygenation: Pre-oxygenation with 100% oxygen Intubation Type: IV induction Laryngoscope Size: Mac and 3 Grade View: Grade I Tube type: Oral Tube size: 7.5 mm Number of attempts: 1 Airway Equipment and Method: Stylet Placement Confirmation: ETT inserted through vocal cords under direct vision,  positive ETCO2 and breath sounds checked- equal and bilateral Secured at: 22 cm Tube secured with: Tape Dental Injury: Teeth and Oropharynx as per pre-operative assessment

## 2014-08-17 NOTE — Transfer of Care (Signed)
Immediate Anesthesia Transfer of Care Note  Patient: [name]  Procedure(s) Performed: Procedure(s): LAPAROSCOPIC CHOLECYSTECTOMY WITH INTRAOPERATIVE CHOLANGIOGRAM (N/A)  Patient Location: PACU  Anesthesia Type:General  Level of Consciousness: awake, alert  and oriented  Airway & Oxygen Therapy: Patient Spontanous Breathing and Patient connected to nasal cannula oxygen  Post-op Assessment: Report given to PACU RN and Post -op Vital signs reviewed and stable  Post vital signs: Reviewed and stable  Complications: No apparent anesthesia complications

## 2014-08-17 NOTE — Op Note (Signed)
Kevin DumasDavid L Keehan [id number] [birth year] 08/17/2014  Laparoscopic Cholecystectomy with IOC Procedure Note  Indications: This patient presents with symptomatic gallbladder disease and will undergo laparoscopic cholecystectomy.  Pre-operative Diagnosis: Calculus of gallbladder with acute cholecystitis, without mention of obstruction  Post-operative Diagnosis: Same  Surgeon: Atilano InaWILSON,Leatrice Parilla M   Assistants: Myrtie SomanSharon Hitchcock, Scrub nurse first assist  Anesthesia: General endotracheal anesthesia  ASA Class: 3  Procedure Details  The patient was seen again in the Holding Room. The risks, benefits, complications, treatment options, and expected outcomes were discussed with the patient. The possibilities of reaction to medication, pulmonary aspiration, perforation of viscus, bleeding, recurrent infection, finding a normal gallbladder, the need for additional procedures, failure to diagnose a condition, the possible need to convert to an open procedure, and creating a complication requiring transfusion or operation were discussed with the patient. The likelihood of improving the patient's symptoms with return to their baseline status is good.  The patient and/or family concurred with the proposed plan, giving informed consent. The site of surgery properly noted. The patient was taken to Operating Room, identified as Kevin Dumasavid L Sharp and the procedure verified as Laparoscopic Cholecystectomy with Intraoperative Cholangiogram. A Time Out was held and the above information confirmed. Antibiotic prophylaxis was administered.   Prior to the induction of general anesthesia, antibiotic prophylaxis was administered. General endotracheal anesthesia was then administered and tolerated well. After the induction, the abdomen was prepped with Chloraprep and draped in the sterile fashion. The patient was positioned in the supine position.  Because of his morbid obesity, I elected to gain access to the abdomen using the  Optiview technique. A small incision was made underneath the left subcostal margin nether subxiphoid position. Then using a 0 5 mm laparoscope through a 5 mm trocar I advanced it through all layers of the abdominal wall and entered the abdominal cavity. Visually as well as by feel it felt that I was within the abdominal cavity. I connected the insufflation tubing and the initial intra-abdominal pressure reading was 15 mmHg which told me that I was not in the correct space. I pulled back on the trocar and still got a high pressure reading. I removed the trocar. I then went back through the same incision with the laparoscope along with the trocar and did not go in as far. Insufflation tubing was connected which showed an intra-abdominal pressure reading of 5 mmHg. We then had smooth insufflation up to a patient pressure 15 mmHg. The laparoscope was advanced in the abdominal cavity was revealed. There is evidence that I had gone through the omentum along the superior edge of the transverse colon. A small incision was made in the supraumbilical position and 11 mm trochar placed under direct visualization. I then placed (2) 5 mm trochars in the right abdomen all under direct visualization after local had been infiltrated. I lifted the omentum up into the upper abdomen to expose the transverse colon. There was evidence of some CO2 gas near the colonic wall in the transverse colon. There is no evidence of  Colotomy. I did mark the area with titanium clips so I could come back and inspected at the end of the case.   We positioned the patient in reverse Trendelenburg, tilted slightly to the patient's left.  The gallbladder was identified, the fundus grasped and retracted cephalad. The gallbladder was very thickened and edematous and inflamed. Prior to retracting it I did have to aspirate it. Adhesions were lysed bluntly and with the electrocautery where indicated, taking care  not to injure any adjacent organs or viscus.  The infundibulum was grasped and retracted laterally, exposing the peritoneum overlying the triangle of Calot. This was then divided and exposed in a blunt fashion. A critical view of the cystic duct and cystic artery was obtained.  The cystic duct was clearly identified and bluntly dissected circumferentially. The cystic duct was ligated with a clip distally.   An incision was made in the cystic duct and the [facility] cholangiogram catheter introduced. The catheter was secured using a clip. A cholangiogram was then obtained which showed good visualization of the distal and proximal biliary tree with no sign of filling defects or obstruction.  Contrast flowed easily into the duodenum. The catheter was then removed.   The cystic duct was then ligated with clips and divided. The cystic artery which had been identified and dissected free was ligated with clips and divided as well.   The gallbladder was dissected from the liver bed in retrograde fashion with the electrocautery. The gallbladder was removed and placed in an Endocatch sac.  The liver bed was irrigated and inspected. Hemostasis was achieved with the electrocautery. Copious irrigation was utilized and was repeatedly aspirated until clear. We placed the patient back in the supine position. I then turned my attention back to the transverse colon. Using the harmonic scalpel lifted the omentum off of the portion of the transverse colon where it had been noted to have some CO2 insufflation. I bluntly dissected in this area carefully with a blunt grasper. I was able to see the entire colonic wall at this point. I did not devascularize the colon while doing this. After carefully inspecting the area for 15 minutes I could find no evidence of injury to the colon.  The gallbladder and Endocatch sac were then removed through the umbilical port site after the fascial incision was enlarged.   The suture passer was used to close the umbilical fascial defect with 0 Vicryl  under laparoscopic guidance. It took four interrupted 0 Vicryls to achieve no air leak    We again inspected the right upper quadrant for hemostasis.  The umbilical closure was inspected and there was no air leak and nothing trapped within the closure. Pneumoperitoneum was released as we removed the trocars.  4-0 Monocryl was used to close the skin.   Benzoin, steri-strips, and clean dressings were applied. The patient was then extubated and brought to the recovery room in stable condition. Instrument, sponge, and needle counts were correct at closure and at the conclusion of the case.   Findings: Cholecystitis with Cholelithiasis  Estimated Blood Loss: Minimal         Drains: none         Specimens: Gallbladder           Complications: None; patient tolerated the procedure well.         Disposition: PACU - hemodynamically stable.         Condition: stable  Mary SellaEric M. Andrey CampanileWilson, MD, FACS General, Bariatric, & Minimally Invasive Surgery [facility], [place]

## 2014-08-17 NOTE — Interval H&P Note (Signed)
History and Physical Interval Note:  08/17/2014 10:17 AM  Reyne Dumasavid L Stormes  has presented today for surgery, with the diagnosis of Gallstones  The various methods of treatment have been discussed with the patient and family. After consideration of risks, benefits and other options for treatment, the patient has consented to  Procedure(s): LAPAROSCOPIC CHOLECYSTECTOMY WITH INTRAOPERATIVE CHOLANGIOGRAM (N/A) as a surgical intervention .  The patient's history has been reviewed, patient examined, no change in status, stable for surgery.  I have reviewed the patient's chart and labs.  Questions were answered to the patient's satisfaction.    Mary SellaEric M. Andrey CampanileWilson, MD, FACS General, Bariatric, & Minimally Invasive Surgery [facility], [place]   [facility] AntonioWILSON,Akshath Mccarey M

## 2014-08-17 NOTE — Anesthesia Postprocedure Evaluation (Signed)
  Anesthesia Post-op Note  Patient: [name]  Procedure(s) Performed: Procedure(s): LAPAROSCOPIC CHOLECYSTECTOMY WITH INTRAOPERATIVE CHOLANGIOGRAM (N/A)  Patient Location: PACU  Anesthesia Type:General  Level of Consciousness: awake, alert  and oriented  Airway and Oxygen Therapy: Patient Spontanous Breathing  Post-op Pain: none  Post-op Assessment: Post-op Vital signs reviewed  Post-op Vital Signs: Reviewed  Last Vitals:  Filed Vitals:   08/17/14 1515  BP: 149/57  Pulse: 91  Temp:   Resp: 14    Complications: No apparent anesthesia complications

## 2014-08-17 NOTE — Progress Notes (Signed)
[facility] [place]  Subjective  - Just got back from OR some pain, drowsy   Objective 149/69 90 97.8 F (36.6 C) (Axillary) 18 98%  Intake/Output Summary (Last 24 hours) at 08/17/14 1622 Last data filed at 08/17/14 1145  Gross per 24 hour  Intake   1360 ml  Output    450 ml  Net    910 ml     Assessment/Planning: S/p cholecystectomy  AAA stable  D/c home when ok with general surgery Will need follow up with Dr [name] and US AAA in 6 months  Daxton Nydam E 08/17/2014 4:22 PM --  Laboratory Lab Results:  Recent Labs  08/16/14 0506 08/17/14 0339  WBC 12.6* 11.1*  HGB 13.2 12.2*  HCT 40.6 36.9*  PLT 223 207   BMET  Recent Labs  08/16/14 0506 08/17/14 0339  NA 138 137  K 4.8 3.9  CL 100 99  CO2 27 25  GLUCOSE 135* 140*  BUN 16 18  CREATININE 1.52* 1.68*  CALCIUM 9.0 8.6    COAG Lab Results  Component Value Date   INR 0.99 08/15/2014   INR 1.94* 10/20/2011   INR 1.10 10/19/2011   No results found for: PTT

## 2014-08-17 NOTE — H&P (View-Only) (Signed)
[facility] Surgery Consult Note  RISHIT BURKHALTER [birth year]  [id number].    Requesting MD: Dr. Oneida Alar Chief Complaint/Reason for Consult: Cholecystitis  HPI:  79 y/o male with PMH CAD MI in 2012 s/p stent placement (Dr. Johnsie Cancel), DM, HLD, and AAA presented to [facility] with diffuse severe mid abdominal pain which started on 08/15/14 around 4am.  The pain has been constant and is only helped with IV pain medication.  Noted having significant nausea on Sunday, but that was replaced with sharp 10/10 pain.  He first describes his pain as being lower, but upon further questioning it is in the RUQ/epigastrium more than lower.  He was found to have a large gallstone stuck in the neck of the gallbladder.  He also was found to have a 4.8cm non-ruptured AAA.  He was transferred to [facility] and admitted to Dr. Oneida Alar service for further workup of his AAA.  He's been having normal BM's and flatus.  No CP/SOB, vomiting, fever/chills, no acholic stools or dark urine, no jaundice of skin/eyes.  Pain does not radiate.  No alleviating/aggrevating factors.  Only abdominal surgery was appendectomy when he was in high school.  ROS: All systems reviewed and otherwise negative except for as above  Family History  Problem Relation Age of Onset  . Diabetes Other     Past Medical History  Diagnosis Date  . Diabetes mellitus   . Hypercholesteremia   . Coronary artery disease 2012    Stent placed    Past Surgical History  Procedure Laterality Date  . Spine surgery    . Fracture surgery  08-2011    ORIF right ankle  . Femoral-tibial bypass graft  09/27/2011    Procedure: BYPASS GRAFT FEMORAL-TIBIAL ARTERY;  Surgeon: Theotis Burrow, MD;  Location: [place] [facility];  Service: Vascular;  Laterality: Right;  Procedure started @1607           Femoral/Anterior Tibial Bypass right .  Right Femoral Endarterectomy.  . Tibia im nail insertion  09/27/2011    Procedure: INTRAMEDULLARY (IM) NAIL TIBIAL;  Surgeon: Newt Minion,  MD;  Location: [place];  Service: Orthopedics;  Laterality: Right;  Procedure started @1510 -1536 in addition: Removal of Hardware Right Fibula  . Back surgery  1990's  . Amputation  10/18/2011    Procedure: AMPUTATION BELOW KNEE;  Surgeon: Newt Minion, MD;  Location: [facility] [place];  Service: Orthopedics;  Laterality: Right;  Right Below Knee Amputation    Social History:  reports that he has never smoked. He does not have any smokeless tobacco history on file. He reports that he drinks alcohol. He reports that he does not use illicit drugs.  Allergies: No Known Allergies  Medications Prior to Admission  Medication Sig Dispense Refill  . aspirin EC 81 MG tablet Take 81 mg by mouth every evening.      . clopidogrel (PLAVIX) 75 MG tablet Take 75 mg by mouth daily.    . fenofibrate 160 MG tablet Take 160 mg by mouth daily.    . insulin glargine (LANTUS) 100 UNIT/ML injection Inject 40 Units into the skin at bedtime.      . metoprolol succinate (TOPROL-XL) 25 MG 24 hr tablet Take 25 mg by mouth 2 (two) times daily.    [name] NOVOLIN R RELION 100 UNIT/ML injection 25 Units 2 (two) times daily before a meal.    . simvastatin (ZOCOR) 40 MG tablet Take 40 mg by mouth at bedtime.        Blood  pressure 125/46, pulse 82, temperature 98.9 F (37.2 C), temperature source Oral, resp. rate 18, height 5' 10"  (1.778 m), weight 205 lb 0.4 oz (93 kg), SpO2 90 %. Physical Exam: General: pleasant, WD/WN white male who is laying in bed in NAD HEENT: head is normocephalic, atraumatic.  Sclera are noninjected, non-icteric.  PERRL.  Ears and nose without any masses or lesions.  Mouth is pink and moist Heart: regular, rate, and rhythm.  No obvious murmurs, gallops, or rubs noted.  Palpable pedal pulses bilaterally Lungs: CTAB, no wheezes, rhonchi, or rales noted.  Respiratory effort nonlabored Abd: Obese, soft, tender in RUQ and epigastrium, mildly distended, +BS, no masses, hernias, or organomegaly, appendectomy scar in RLQ  (open) MS: Right BKA, left leg CSM intact, no tenderness to the LE's, UE normal with good pulses Skin: warm and dry with no masses, lesions, or rashes Psych: A&Ox3 with an appropriate affect.   Results for orders placed or performed during the hospital encounter of 08/15/14 (from the past 48 hour(s))  Glucose, capillary     Status: Abnormal   Collection Time: 08/15/14  6:20 PM  Result Value Ref Range   Glucose-Capillary 136 (H) 70 - 99 mg/dL   Comment 1 Orig Pt [place] entered as [id number]   CBC     Status: Abnormal   Collection Time: 08/15/14  9:33 PM  Result Value Ref Range   WBC 13.2 (H) 4.0 - 10.5 K/uL   RBC 4.59 4.22 - 5.81 MIL/uL   Hemoglobin 13.7 13.0 - 17.0 g/dL   HCT 41.4 39.0 - 52.0 %   MCV 90.2 78.0 - 100.0 fL   MCH 29.8 26.0 - 34.0 pg   MCHC 33.1 30.0 - 36.0 g/dL   RDW 13.4 11.5 - 15.5 %   Platelets 224 150 - 400 K/uL  Comprehensive metabolic panel     Status: Abnormal   Collection Time: 08/15/14  9:33 PM  Result Value Ref Range   Sodium 140 137 - 147 mEq/L   Potassium 4.2 3.7 - 5.3 mEq/L   Chloride 100 96 - 112 mEq/L   CO2 27 19 - 32 mEq/L   Glucose, Bld 123 (H) 70 - 99 mg/dL   BUN 16 6 - 23 mg/dL   Creatinine, Ser 1.41 (H) 0.50 - 1.35 mg/dL   Calcium 9.1 8.4 - 10.5 mg/dL   Total Protein 7.1 6.0 - 8.3 g/dL   Albumin 3.4 (L) 3.5 - 5.2 g/dL   AST 19 0 - 37 U/L   ALT 16 0 - 53 U/L   Alkaline Phosphatase 46 39 - 117 U/L   Total Bilirubin 0.3 0.3 - 1.2 mg/dL   GFR calc non Af Amer 47 (L) >90 mL/min   GFR calc Af Amer 54 (L) >90 mL/min    Comment: (NOTE) The eGFR has been calculated using the CKD EPI equation. This calculation has not been validated in all clinical situations. eGFR's persistently <90 mL/min signify possible Chronic Kidney Disease.    Anion gap 13 5 - 15  Protime-INR     Status: None   Collection Time: 08/15/14  9:33 PM  Result Value Ref Range   Prothrombin Time 13.2 11.6 - 15.2 seconds   INR 0.99 0.00 - 1.49  Glucose, capillary     Status:  Abnormal   Collection Time: 08/15/14  9:53 PM  Result Value Ref Range   Glucose-Capillary 120 (H) 70 - 99 mg/dL   Comment 1 Documented in Chart    Comment 2 Notify  RN   Urinalysis, Routine w reflex microscopic     Status: Abnormal   Collection Time: 08/15/14 10:24 PM  Result Value Ref Range   Color, Urine YELLOW YELLOW   APPearance CLEAR CLEAR   Specific Gravity, Urine >1.046 (H) 1.005 - 1.030   pH 6.0 5.0 - 8.0   Glucose, UA 250 (A) NEGATIVE mg/dL   Hgb urine dipstick NEGATIVE NEGATIVE   Bilirubin Urine NEGATIVE NEGATIVE   Ketones, ur NEGATIVE NEGATIVE mg/dL   Protein, ur NEGATIVE NEGATIVE mg/dL   Urobilinogen, UA 0.2 0.0 - 1.0 mg/dL   Nitrite NEGATIVE NEGATIVE   Leukocytes, UA NEGATIVE NEGATIVE    Comment: MICROSCOPIC NOT DONE ON URINES WITH NEGATIVE PROTEIN, BLOOD, LEUKOCYTES, NITRITE, OR GLUCOSE <1000 mg/dL.  Glucose, capillary     Status: Abnormal   Collection Time: 08/16/14 12:41 AM  Result Value Ref Range   Glucose-Capillary 106 (H) 70 - 99 mg/dL  Glucose, capillary     Status: Abnormal   Collection Time: 08/16/14  4:06 AM  Result Value Ref Range   Glucose-Capillary 136 (H) 70 - 99 mg/dL  CBC     Status: Abnormal   Collection Time: 08/16/14  5:06 AM  Result Value Ref Range   WBC 12.6 (H) 4.0 - 10.5 K/uL   RBC 4.56 4.22 - 5.81 MIL/uL   Hemoglobin 13.2 13.0 - 17.0 g/dL   HCT 40.6 39.0 - 52.0 %   MCV 89.0 78.0 - 100.0 fL   MCH 28.9 26.0 - 34.0 pg   MCHC 32.5 30.0 - 36.0 g/dL   RDW 13.6 11.5 - 15.5 %   Platelets 223 150 - 400 K/uL  Basic metabolic panel     Status: Abnormal   Collection Time: 08/16/14  5:06 AM  Result Value Ref Range   Sodium 138 137 - 147 mEq/L   Potassium 4.8 3.7 - 5.3 mEq/L   Chloride 100 96 - 112 mEq/L   CO2 27 19 - 32 mEq/L   Glucose, Bld 135 (H) 70 - 99 mg/dL   BUN 16 6 - 23 mg/dL   Creatinine, Ser 1.52 (H) 0.50 - 1.35 mg/dL   Calcium 9.0 8.4 - 10.5 mg/dL   GFR calc non Af Amer 42 (L) >90 mL/min   GFR calc Af Amer 49 (L) >90 mL/min     Comment: (NOTE) The eGFR has been calculated using the CKD EPI equation. This calculation has not been validated in all clinical situations. eGFR's persistently <90 mL/min signify possible Chronic Kidney Disease.    Anion gap 11 5 - 15  Amylase     Status: None   Collection Time: 08/16/14  5:06 AM  Result Value Ref Range   Amylase 45 0 - 105 U/L  Lipase, blood     Status: None   Collection Time: 08/16/14  5:06 AM  Result Value Ref Range   Lipase 30 11 - 59 U/L  Glucose, capillary     Status: Abnormal   Collection Time: 08/16/14  8:16 AM  Result Value Ref Range   Glucose-Capillary 136 (H) 70 - 99 mg/dL   Comment 1 Notify RN   Glucose, capillary     Status: Abnormal   Collection Time: 08/16/14 11:43 AM  Result Value Ref Range   Glucose-Capillary 163 (H) 70 - 99 mg/dL   Comment 1 Notify RN    US Abdomen Complete  08/16/2014   CLINICAL DATA:  Acute epigastric pain.  EXAM: ULTRASOUND ABDOMEN COMPLETE  COMPARISON:  Acute abdominal series 11  08/15/2014 and CT abdomen and pelvis 02/24/2006  FINDINGS: Gallbladder: Cholelithiasis is present. There is an approximately 2.5 cm stone in the region of the gallbladder neck. There are additional smaller stones. Gallbladder sludge is present. The gallbladder wall is thickened and contains some small echogenic foci with associated ring down artifact. Gallbladder wall thickening measures up to 8.8 mm. The sonographic Percell Miller sign is negative. No pericholecystic fluid is appreciated.  Common bile duct: Diameter: 4 mm  Liver: Echotexture of the liver appears increased and slightly heterogeneous. It is difficult to completely penetrate the liver with ultrasound being. Findings suggest fatty infiltration the liver. No definite focal hepatic mass is identified by ultrasound.  IVC: Not well visualized  Pancreas: Not well visualized  Spleen: Size and appearance within normal limits.  Right Kidney: Length: 10.5 cm. Echogenicity within normal limits. No mass or  hydronephrosis visualized.  Left Kidney: Length: 11.5 cm. Echogenicity within normal limits. No mass or hydronephrosis visualized.  Abdominal aorta: An infrarenal abdominal aortic aneurysm is visualized, measuring up to 4.9 cm AP diameter and 6.4 cm and craniocaudal span.  Other findings: None.  IMPRESSION: 1. Cholelithiasis. Gallstones are present, including a 2.5 cm stone in the gallbladder neck. Gallbladder wall thickening is present, measuring up to 8.8 mm. Echogenic foci within the gallbladder wall with associated ring down artifact suggest adenomyomatosis of the gallbladder, which can be a cause of chronic wall thickening. In the setting of acute pain, the possibility of acute cholecystitis as a cause of wall thickening cannot be excluded. The sonographer states that the patient has a negative sonographic Murphy's sign at this time. 2. Infrarenal abdominal aortic aneurysm. This aneurysm appears increased in size since the CT of 02/24/2006. Please also see the CT chest abdomen pelvis performed yesterday, 08/15/2014. 3. Probable fatty infiltration of the liver. 4. Essentially nonvisualization of the inferior vena cava and pancreas. These results will be called to the ordering clinician or representative by the Radiologist Assistant, and communication documented in the PACS or zVision Dashboard.   Electronically Signed   By: Curlene Dolphin M.D.   On: 08/16/2014 11:05      Assessment/Plan Cholecystitis with choledocholithiasis (stone in neck) CAD s/p stent in 2012 (Dr. Johnsie Cancel) DM on insulin HLD AAA at 4.8cm - non-ruptured  Plan: 1.  Likely needs lap chole given symptoms, images.  LFT's are normal, WBC is elevated at 12.6.  If Dr. Oneida Alar feels as though he needs nothing done for his AAA and cardiology clears him we can likely proceed with lap chole as early as tomorrow.  Dr. Redmond Pulling to see and evaluate and decide on timing.   2.  NPO, bowel rest, IVF, pain control, antiemetics, antibiotics (rocephin Day  #1) 3.  SCD's and hold dvt proph after MN, continue to hold plavix.  Last plavix/aspirin dose was on Sunday.  4.  Ambulate and IS as able 5.  Discussed procedure with the patient and his wife and he would like to proceed.   Coralie Keens, [facility] 08/16/2014, 2:53 PM Pager: [phone]

## 2014-08-17 NOTE — Progress Notes (Addendum)
  [facility] Progress Note  08/17/2014 8:04 AM  Subjective:  Having diffuse abdominal pain. Denies back pain.    Filed Vitals:   08/17/14 0416  BP: 132/58  Pulse: 91  Temp: 97.9 F (36.6 C)  Resp: 16    Physical Exam: General: WDWN male in NAD Abdomen: +bs, soft, no guarding, no distension, diffuse mild tenderness to palpation Cardiac: regular rate and rhythm  Lungs: non labored breathing.   CBC    Component Value Date/Time   WBC 11.1* 08/17/2014 0339   RBC 4.10* 08/17/2014 0339   HGB 12.2* 08/17/2014 0339   HCT 36.9* 08/17/2014 0339   PLT 207 08/17/2014 0339   MCV 90.0 08/17/2014 0339   MCH 29.8 08/17/2014 0339   MCHC 33.1 08/17/2014 0339   RDW 13.3 08/17/2014 0339   LYMPHSABS 3.4 08/17/2014 0339   MONOABS 0.9 08/17/2014 0339   EOSABS 0.3 08/17/2014 0339   BASOSABS 0.0 08/17/2014 0339    BMET    Component Value Date/Time   NA 137 08/17/2014 0339   K 3.9 08/17/2014 0339   CL 99 08/17/2014 0339   CO2 25 08/17/2014 0339   GLUCOSE 140* 08/17/2014 0339   BUN 18 08/17/2014 0339   CREATININE 1.68* 08/17/2014 0339   CALCIUM 8.6 08/17/2014 0339   GFRNONAA 38* 08/17/2014 0339   GFRAA 44* 08/17/2014 0339    INR    Component Value Date/Time   INR 0.99 08/15/2014 2133     Intake/Output Summary (Last 24 hours) at 08/17/14 0804 Last data filed at 08/16/14 2100  Gross per 24 hour  Intake    360 ml  Output    600 ml  Net   -240 ml     Assessment:  79 y.o. male with abdominal pain. AAA-4.8 cm non ruptured.    Plan: -Abdominal pain: US consistent with cholecystitis with choledocholithiasis:  OR for lap chole today -Pain not likely from AAA.    Maris BergerKimberly Trinh, PA-C [facility] Office: [phone] Pager: [phone] 08/17/2014 8:04 AM     Agree with the above Patient is currently in the operating room.  We will evaluate his abdominal pain to see if it resolves with cholecystectomy.  Durene CalWells Brabham

## 2014-08-17 NOTE — Progress Notes (Signed)
[facility] in holding.

## 2014-08-18 ENCOUNTER — Other Ambulatory Visit: Payer: Self-pay | Admitting: *Deleted

## 2014-08-18 ENCOUNTER — Encounter (HOSPITAL_COMMUNITY): Payer: Self-pay | Admitting: General Surgery

## 2014-08-18 DIAGNOSIS — I714 Abdominal aortic aneurysm, without rupture, unspecified: Secondary | ICD-10-CM

## 2014-08-18 LAB — CBC
HCT: 38.7 % — ABNORMAL LOW (ref 39.0–52.0)
Hemoglobin: 12.6 g/dL — ABNORMAL LOW (ref 13.0–17.0)
MCH: 29.1 pg (ref 26.0–34.0)
MCHC: 32.6 g/dL (ref 30.0–36.0)
MCV: 89.4 fL (ref 78.0–100.0)
PLATELETS: 210 10*3/uL (ref 150–400)
RBC: 4.33 MIL/uL (ref 4.22–5.81)
RDW: 13.1 % (ref 11.5–15.5)
WBC: 11.4 10*3/uL — AB (ref 4.0–10.5)

## 2014-08-18 LAB — GLUCOSE, CAPILLARY
GLUCOSE-CAPILLARY: 218 mg/dL — AB (ref 70–99)
Glucose-Capillary: 118 mg/dL — ABNORMAL HIGH (ref 70–99)
Glucose-Capillary: 148 mg/dL — ABNORMAL HIGH (ref 70–99)
Glucose-Capillary: 179 mg/dL — ABNORMAL HIGH (ref 70–99)
Glucose-Capillary: 182 mg/dL — ABNORMAL HIGH (ref 70–99)
Glucose-Capillary: 190 mg/dL — ABNORMAL HIGH (ref 70–99)

## 2014-08-18 LAB — BASIC METABOLIC PANEL
ANION GAP: 12 (ref 5–15)
BUN: 17 mg/dL (ref 6–23)
CALCIUM: 8.9 mg/dL (ref 8.4–10.5)
CO2: 26 mEq/L (ref 19–32)
Chloride: 98 mEq/L (ref 96–112)
Creatinine, Ser: 1.58 mg/dL — ABNORMAL HIGH (ref 0.50–1.35)
GFR calc Af Amer: 47 mL/min — ABNORMAL LOW (ref >90)
GFR, EST NON AFRICAN AMERICAN: 41 mL/min — AB (ref >90)
Glucose, Bld: 140 mg/dL — ABNORMAL HIGH (ref 70–99)
Potassium: 5 mEq/L (ref 3.7–5.3)
SODIUM: 136 meq/L — AB (ref 137–147)

## 2014-08-18 MED ORDER — IPRATROPIUM-ALBUTEROL 0.5-2.5 (3) MG/3ML IN SOLN: 3.0000 mL | RESPIRATORY_TRACT | Status: DC

## 2014-08-18 MED ORDER — IPRATROPIUM-ALBUTEROL 0.5-2.5 (3) MG/3ML IN SOLN
3.0000 mL | RESPIRATORY_TRACT | Status: DC | PRN
  Administered 2014-08-18: 3 mL via RESPIRATORY_TRACT
  Filled 2014-08-18 (×2): qty 3

## 2014-08-18 NOTE — Care Management Note (Unsigned)
    Page 1 of 1   08/18/2014     4:58:23 PM CARE MANAGEMENT NOTE 08/18/2014  Patient:  [name],[name]   Account Number:  [id number]  Date Initiated:  08/18/2014  Documentation initiated by:  Teresha Hanks  Subjective/Objective Assessment:   Pt adm on 08/15/14 with abd pain.  PTA, pt independent, lives with spouse.     Action/Plan:   Will follow for dc needs as pt progresses.   Anticipated DC Date:  08/19/2014   Anticipated DC Plan:  HOME/SELF CARE      DC Planning Services  CM consult      Choice offered to / List presented to:             Status of service:  In process, will continue to follow Medicare Important Message given?  YES (If response is "NO", the following Medicare IM given date fields will be blank) Date Medicare IM given:  08/18/2014 Medicare IM given by:  Amor Hyle Date Additional Medicare IM given:   Additional Medicare IM given by:    Discharge Disposition:    Per UR Regulation:  Reviewed for med. necessity/level of care/duration of stay  If discussed at Long Length of Stay Meetings, dates discussed:    Comments:

## 2014-08-18 NOTE — Plan of Care (Signed)
Problem: Phase I Progression Outcomes Goal: Voiding-avoid urinary catheter unless indicated Outcome: Completed/Met Date Met:  08/18/14     

## 2014-08-18 NOTE — Progress Notes (Signed)
[facility]  Subjective  - some abdominal soreness, tolerated clear liquids   Objective 127/55 87 98.6 F (37 C) (Oral) 18 96%  Intake/Output Summary (Last 24 hours) at 08/18/14 0802 Last data filed at 08/18/14 [id number]  Gross per 24 hour  Intake   1360 ml  Output   1800 ml  Net   -440 ml   Abdomen distended mild diffuse tenderness  Assessment/Planning: AAA stable most likely cause of pain was gallbladder  Plan to d/c home when clear by Gen Surg  Needs f/u US 6 months AAA and see Dr Frazier ButtBrabham  Sharp,[name] 08/18/2014 8:02 AM --  Laboratory Lab Results:  Recent Labs  08/17/14 0339 08/18/14 0249  WBC 11.1* 11.4*  HGB 12.2* 12.6*  HCT 36.9* 38.7*  PLT 207 210   BMET  Recent Labs  08/17/14 0339 08/18/14 0249  NA 137 136*  K 3.9 5.0  CL 99 98  CO2 25 26  GLUCOSE 140* 140*  BUN 18 17  CREATININE 1.68* 1.58*  CALCIUM 8.6 8.9    COAG Lab Results  Component Value Date   INR 0.99 08/15/2014   INR 1.94* 10/20/2011   INR 1.10 10/19/2011   No results found for: PTT

## 2014-08-18 NOTE — Progress Notes (Signed)
1 Day Post-Op  Subjective: POD #1 s/p lap chole with IOC. Tolerated reg diet for lunch. Has been OOB to chair. Is currently without complaint and asks when he will be able to go home. Incisional pain is minimal.  Objective: Vital signs in last 24 hours: weaned off 4L [place] O2 and now with mild hypoxia. Temp:  [97.8 F (36.6 C)-99.3 F (37.4 C)] 99.3 F (37.4 C) (11/12 1325) Pulse Rate:  [84-97] 85 (11/12 1325) Resp:  [10-18] 18 (11/12 1325) BP: (127-198)/(50-94) 142/59 mmHg (11/12 1325) SpO2:  [88 %-100 %] 88 % (11/12 1325) Last BM Date: 08/15/14  Intake/Output from previous day: 11/11 0701 - 11/12 0700 In: 1360 [P.O.:360; I.V.:1000] Out: 1500 [Urine:1500] Intake/Output this shift: Total I/O In: 480 [P.O.:480] Out: 650 [Urine:650]  PE: Gen: NAD, seated in bed watching TV, without complaint Card: RRR Pulm: no tachypnea or visible increased work of breathing. +moderate bilateral end expiratory wheezing. Mild decrease in air movement B/L Abd: soft, mild expected incisional tenderness. +BS Ext: no pedal edema  Lab Results:   Recent Labs  08/17/14 0339 08/18/14 0249  WBC 11.1* 11.4*  HGB 12.2* 12.6*  HCT 36.9* 38.7*  PLT 207 210   BMET  Recent Labs  08/17/14 0339 08/18/14 0249  NA 137 136*  K 3.9 5.0  CL 99 98  CO2 25 26  GLUCOSE 140* 140*  BUN 18 17  CREATININE 1.68* 1.58*  CALCIUM 8.6 8.9   PT/INR  Recent Labs  08/15/14 2133  LABPROT 13.2  INR 0.99   CMP     Component Value Date/Time   NA 136* 08/18/2014 0249   K 5.0 08/18/2014 0249   CL 98 08/18/2014 0249   CO2 26 08/18/2014 0249   GLUCOSE 140* 08/18/2014 0249   BUN 17 08/18/2014 0249   CREATININE 1.58* 08/18/2014 0249   CALCIUM 8.9 08/18/2014 0249   PROT 6.4 08/17/2014 0339   ALBUMIN 2.9* 08/17/2014 0339   AST 16 08/17/2014 0339   ALT 12 08/17/2014 0339   ALKPHOS 38* 08/17/2014 0339   BILITOT 0.5 08/17/2014 0339   GFRNONAA 41* 08/18/2014 0249   GFRAA 47* 08/18/2014 0249   Lipase      Component Value Date/Time   LIPASE 30 08/16/2014 0506       Studies/Results: Dg Cholangiogram Operative  08/17/2014   CLINICAL DATA:  Laparoscopic cholecystectomy. Intraoperative cholangiogram to evaluate for gallstones.  EXAM: INTRAOPERATIVE CHOLANGIOGRAM  FLUOROSCOPY TIME:  7 seconds  COMPARISON:  Abdominal ultrasound - 08/16/2014 ; CT abdomen and pelvis - 02/24/2006  FINDINGS: Intraoperative angiographic images of the right upper abdominal quadrant during laparoscopic cholecystectomy are provided for review.  Surgical clips overlie the expected location of the gallbladder fossa.  Contrast injection demonstrates selective cannulation of the central aspect of the cystic duct.  There is passage of contrast through the central aspect of the cystic duct with filling of a non dilated common bile duct. There is passage of contrast though the CBD and into the descending portion of the duodenum.  There is minimal reflux of injected contrast into the common hepatic duct and central aspect of the non dilated intrahepatic biliary system.  There are no discrete filling defects within the opacified portions of the biliary system to suggest the presence of choledocholithiasis.  IMPRESSION: No evidence of choledocholithiasis.   Electronically Signed   By: Simonne ComeJohn  Watts M.D.   On: 08/17/2014 15:40    Anti-infectives: Anti-infectives    Start     Dose/Rate Route Frequency Ordered  Stop   08/17/14 1800  cefTRIAXone (ROCEPHIN) 2 g in dextrose 5 % 50 mL IVPB - Premix    Comments:  Pharmacy may adjust dosing strength / duration / interval for maximal efficacy   2 g100 mL/hr over 30 Minutes Intravenous Every 24 hours 08/17/14 1544 08/17/14 1810   08/16/14 1515  cefTRIAXone (ROCEPHIN) 2 g in dextrose 5 % 50 mL IVPB - Premix  Status:  Discontinued    Comments:  Pharmacy may adjust dosing strength / duration / interval for maximal efficacy   2 g100 mL/hr over 30 Minutes Intravenous Every 24 hours 08/16/14 1504 08/17/14  1544       Assessment/Plan  s/p Procedure(s): LAPAROSCOPIC CHOLECYSTECTOMY WITH INTRAOPERATIVE CHOLANGIOGRAM (N/A)  Looks well and without complaint, but with mild hypoxia when weaned off 4L [place] 02. No hx of oxygen dependence at home. Wheezing on exam. Afebrile and normotensive. Tolerating regular diet. Mobility limited. Only OOB to chair. Will D/C IVF as he is eating and drinking well. Written for DuoNeb x 1. Needs to increase pulmonary toilet following neb. From gen surg standpoint, able to be discharged to home. Will defer to admitting service with regard to further management and disposition regarding pulmonary status.   LOS: 3 days    Ardis RowanPRESSON, Vonceil [place] LEE, [facility] Pager [phone] 08/18/2014, 1:39 PM

## 2014-08-18 NOTE — Progress Notes (Signed)
Patient ID: Kevin DumasDavid L Sharp, male   DOB: [birth year], 79 y.o.   MRN: [id number]    Subjective:  Denies SSCP, palpitations or Dyspnea Taking PO post GB surgery  Objective:  Filed Vitals:   08/17/14 1515 08/17/14 1553 08/17/14 2014 08/18/14 0500  BP: 149/57 149/69 132/50 127/55  Pulse: 91 90 85 87  Temp:  97.8 F (36.6 C) 98.5 F (36.9 C) 98.6 F (37 C)  TempSrc:  Axillary Oral Oral  Resp: 14 18 18 18   Height:      Weight:      SpO2: 96% 98% 96% 96%    Intake/Output from previous day:  Intake/Output Summary (Last 24 hours) at 08/18/14 [id number] Last data filed at 08/18/14 [id number]  Gross per 24 hour  Intake   1360 ml  Output   1600 ml  Net   -240 ml    Physical Exam: Affect appropriate Chronically ill male  HEENT: normal Neck supple with no adenopathy JVP normal no bruits no thyromegaly Lungs clear with no wheezing and good diaphragmatic motion Heart:  S1/S2 no murmur, no rub, gallop or click PMI normal Abdomen: benighn, BS positve, no tenderness, no AAA SP gallbladder surgery no bruit.  No HSM or HJR S/P right AKA No edema Neuro non-focal Skin warm and dry No muscular weakness   Lab Results: Basic Metabolic Panel:  Recent Labs  86/57/8410/08/21 0339 08/18/14 0249  NA 137 136*  K 3.9 5.0  CL 99 98  CO2 25 26  GLUCOSE 140* 140*  BUN 18 17  CREATININE 1.68* 1.58*  CALCIUM 8.6 8.9   Liver Function Tests:  Recent Labs  08/15/14 2133 08/17/14 0339  AST 19 16  ALT 16 12  ALKPHOS 46 38*  BILITOT 0.3 0.5  PROT 7.1 6.4  ALBUMIN 3.4* 2.9*    Recent Labs  08/16/14 0506  LIPASE 30  AMYLASE 45   CBC:  Recent Labs  08/17/14 0339 08/18/14 0249  WBC 11.1* 11.4*  NEUTROABS 6.5  --   HGB 12.2* 12.6*  HCT 36.9* 38.7*  MCV 90.0 89.4  PLT 207 210    Imaging: Dg Cholangiogram Operative  08/17/2014   CLINICAL DATA:  Laparoscopic cholecystectomy. Intraoperative cholangiogram to evaluate for gallstones.  EXAM: INTRAOPERATIVE CHOLANGIOGRAM  FLUOROSCOPY TIME:  7  seconds  COMPARISON:  Abdominal ultrasound - 08/16/2014 ; CT abdomen and pelvis - 02/24/2006  FINDINGS: Intraoperative angiographic images of the right upper abdominal quadrant during laparoscopic cholecystectomy are provided for review.  Surgical clips overlie the expected location of the gallbladder fossa.  Contrast injection demonstrates selective cannulation of the central aspect of the cystic duct.  There is passage of contrast through the central aspect of the cystic duct with filling of a non dilated common bile duct. There is passage of contrast though the CBD and into the descending portion of the duodenum.  There is minimal reflux of injected contrast into the common hepatic duct and central aspect of the non dilated intrahepatic biliary system.  There are no discrete filling defects within the opacified portions of the biliary system to suggest the presence of choledocholithiasis.  IMPRESSION: No evidence of choledocholithiasis.   Electronically Signed   By: Simonne ComeJohn  Watts M.D.   On: 08/17/2014 15:40   [place] Abdomen Complete  08/16/2014   CLINICAL DATA:  Acute epigastric pain.  EXAM: ULTRASOUND ABDOMEN COMPLETE  COMPARISON:  Acute abdominal series 11 08/15/2014 and CT abdomen and pelvis 02/24/2006  FINDINGS: Gallbladder: Cholelithiasis is present. There is an approximately 2.5  cm stone in the region of the gallbladder neck. There are additional smaller stones. Gallbladder sludge is present. The gallbladder wall is thickened and contains some small echogenic foci with associated ring down artifact. Gallbladder wall thickening measures up to 8.8 mm. The sonographic Eulah PontMurphy sign is negative. No pericholecystic fluid is appreciated.  Common bile duct: Diameter: 4 mm  Liver: Echotexture of the liver appears increased and slightly heterogeneous. It is difficult to completely penetrate the liver with ultrasound being. Findings suggest fatty infiltration the liver. No definite focal hepatic mass is identified by  ultrasound.  IVC: Not well visualized  Pancreas: Not well visualized  Spleen: Size and appearance within normal limits.  Right Kidney: Length: 10.5 cm. Echogenicity within normal limits. No mass or hydronephrosis visualized.  Left Kidney: Length: 11.5 cm. Echogenicity within normal limits. No mass or hydronephrosis visualized.  Abdominal aorta: An infrarenal abdominal aortic aneurysm is visualized, measuring up to 4.9 cm AP diameter and 6.4 cm and craniocaudal span.  Other findings: None.  IMPRESSION: 1. Cholelithiasis. Gallstones are present, including a 2.5 cm stone in the gallbladder neck. Gallbladder wall thickening is present, measuring up to 8.8 mm. Echogenic foci within the gallbladder wall with associated ring down artifact suggest adenomyomatosis of the gallbladder, which can be a cause of chronic wall thickening. In the setting of acute pain, the possibility of acute cholecystitis as a cause of wall thickening cannot be excluded. The sonographer states that the patient has a negative sonographic Murphy's sign at this time. 2. Infrarenal abdominal aortic aneurysm. This aneurysm appears increased in size since the CT of 02/24/2006. Please also see the CT chest abdomen pelvis performed yesterday, 08/15/2014. 3. Probable fatty infiltration of the liver. 4. Essentially nonvisualization of the inferior vena cava and pancreas. These results will be called to the ordering clinician or representative by the Radiologist Assistant, and communication documented in the PACS or zVision Dashboard.   Electronically Signed   By: Britta MccreedySusan  Turner M.D.   On: 08/16/2014 11:05    Cardiac Studies:  ECG:   SR no acute ST/ Twave changes    Telemetry:  NSR no arrhythmia  Echo:   Medications:   . docusate sodium  100 mg Oral BID  . enoxaparin (LOVENOX) injection  40 mg Subcutaneous Q24H  . insulin aspart  0-20 Units Subcutaneous 6 times per day  . metoprolol succinate  25 mg Oral BID  . pantoprazole  40 mg Oral Daily    . potassium chloride  20-40 mEq Oral Once     . sodium chloride 75 mL/hr at 08/18/14 0127    Assessment/Plan:  CAD:  Previous stent to LAD 2013  Stable no chest pain resume ASA continue beta blocker GB:  Wounds ok taking PO possible d/c today GERD continue pantoprazole  Charlton HawsPeter Lucillia Corson 08/18/2014, 9:23 AM

## 2014-08-18 NOTE — Discharge Instructions (Signed)
[facility] LAPAROSCOPIC SURGERY: POST OP INSTRUCTIONS Always review your discharge instruction sheet given to you by the facility where your surgery was performed. IF YOU HAVE DISABILITY OR FAMILY LEAVE FORMS, YOU MUST BRING THEM TO THE OFFICE FOR PROCESSING.   DO NOT GIVE THEM TO YOUR DOCTOR.  1. A prescription for pain medication may be given to you upon discharge.  Take your pain medication as prescribed, if needed.  If narcotic pain medicine is not needed, then you may take acetaminophen (Tylenol) or ibuprofen (Advil) as needed. 2. Take your usually prescribed medications unless otherwise directed. 3. If you need a refill on your pain medication, please contact your pharmacy.  They will contact our office to request authorization. Prescriptions will not be filled after 5pm or on week-ends. 4. You should follow a light diet the first few days after arrival home, such as soup and crackers, etc.  Be sure to include lots of fluids daily. 5. Most patients will experience some swelling and bruising in the area of the incisions.  Ice packs will help.  Swelling and bruising can take several days to resolve.  6. It is common to experience some constipation if taking pain medication after surgery.  Increasing fluid intake and taking a stool softener (such as Colace) will usually help or prevent this problem from occurring.  A mild laxative (Milk of Magnesia or Miralax) should be taken according to package instructions if there are no bowel movements after 48 hours. 7. Unless discharge instructions indicate otherwise, you may remove your bandages 48 hours after surgery, and you may shower at that time.  You have steri-strips (small skin tapes) in place directly over the incision.   8. ACTIVITIES:  You may resume regular (light) daily activities beginning the next day--such as daily self-care, walking, climbing stairs--gradually increasing activities as tolerated.  You may have sexual  intercourse when it is comfortable.  Refrain from any heavy lifting or straining until approved by your doctor. a. You may drive when you are no longer taking prescription pain medication, you can comfortably wear a seatbelt, and you can safely maneuver your car and apply brakes. 9. You should see your doctor in the office for a follow-up appointment approximately 2-3 weeks after your surgery.  Make sure that you call for this appointment within a day or two after you arrive home to insure a convenient appointment time. 10. OTHER INSTRUCTIONS:DO NOT LIFT, PUSH, OR PULL ANYTHING GREATER THAN 15 POUNDS FOR 2 WEEKS.  WHEN TO CALL YOUR DOCTOR: 1. Fever over 101.0 2. Inability to urinate 3. Continued bleeding from incision. 4. Increased pain, redness, or drainage from the incision. 5. Increasing abdominal pain  The clinic staff is available to answer your questions during regular business hours.  Please dont hesitate to call and ask to speak to one of the nurses for clinical concerns.  If you have a medical emergency, go to the nearest emergency room or call 911.  A surgeon from [facility] is always on call at the hospital. [address], [address], [place], [place]  [address] ? [address] [address], [place], [place]   [id number] [phone] ? [phone] ? FAX [phone] Web site: [email or web address]

## 2014-08-18 NOTE — Progress Notes (Signed)
1 Day Post-Op  Subjective: C/o some abd soreness. No n/v. States pain well controlled. Had clears this am.   Objective: Vital signs in last 24 hours: Temp:  [97.8 F (36.6 C)-98.9 F (37.2 C)] 98.6 F (37 C) (11/12 0500) Pulse Rate:  [85-97] 87 (11/12 0500) Resp:  [10-18] 18 (11/12 0500) BP: (127-198)/(50-95) 127/55 mmHg (11/12 0500) SpO2:  [89 %-100 %] 96 % (11/12 0500) Last BM Date: 08/15/14  Intake/Output from previous day: 11/11 0701 - 11/12 0700 In: 1360 [P.O.:360; I.V.:1000] Out: 1500 [Urine:1500] Intake/Output this shift: Total I/O In: -  Out: 300 [Urine:300]  Sitting in chair, nontoxic, not ill appearing. Smiling/laughing cta b/l Reg Obese, soft, mild expected TTP. No guarding/rt. Dressing c/d/i No edema  Lab Results:   Recent Labs  08/17/14 0339 08/18/14 0249  WBC 11.1* 11.4*  HGB 12.2* 12.6*  HCT 36.9* 38.7*  PLT 207 210   BMET  Recent Labs  08/17/14 0339 08/18/14 0249  NA 137 136*  K 3.9 5.0  CL 99 98  CO2 25 26  GLUCOSE 140* 140*  BUN 18 17  CREATININE 1.68* 1.58*  CALCIUM 8.6 8.9   PT/INR  Recent Labs  08/15/14 2133  LABPROT 13.2  INR 0.99   ABG No results for input(s): PHART, HCO3 in the last 72 hours.  Invalid input(s): PCO2, PO2  Studies/Results: Dg Cholangiogram Operative  08/17/2014   CLINICAL DATA:  Laparoscopic cholecystectomy. Intraoperative cholangiogram to evaluate for gallstones.  EXAM: INTRAOPERATIVE CHOLANGIOGRAM  FLUOROSCOPY TIME:  7 seconds  COMPARISON:  Abdominal ultrasound - 08/16/2014 ; CT abdomen and pelvis - 02/24/2006  FINDINGS: Intraoperative angiographic images of the right upper abdominal quadrant during laparoscopic cholecystectomy are provided for review.  Surgical clips overlie the expected location of the gallbladder fossa.  Contrast injection demonstrates selective cannulation of the central aspect of the cystic duct.  There is passage of contrast through the central aspect of the cystic duct with  filling of a non dilated common bile duct. There is passage of contrast though the CBD and into the descending portion of the duodenum.  There is minimal reflux of injected contrast into the common hepatic duct and central aspect of the non dilated intrahepatic biliary system.  There are no discrete filling defects within the opacified portions of the biliary system to suggest the presence of choledocholithiasis.  IMPRESSION: No evidence of choledocholithiasis.   Electronically Signed   By: Simonne ComeJohn  Watts M.D.   On: 08/17/2014 15:40   [place] Abdomen Complete  08/16/2014   CLINICAL DATA:  Acute epigastric pain.  EXAM: ULTRASOUND ABDOMEN COMPLETE  COMPARISON:  Acute abdominal series 11 08/15/2014 and CT abdomen and pelvis 02/24/2006  FINDINGS: Gallbladder: Cholelithiasis is present. There is an approximately 2.5 cm stone in the region of the gallbladder neck. There are additional smaller stones. Gallbladder sludge is present. The gallbladder wall is thickened and contains some small echogenic foci with associated ring down artifact. Gallbladder wall thickening measures up to 8.8 mm. The sonographic Eulah PontMurphy sign is negative. No pericholecystic fluid is appreciated.  Common bile duct: Diameter: 4 mm  Liver: Echotexture of the liver appears increased and slightly heterogeneous. It is difficult to completely penetrate the liver with ultrasound being. Findings suggest fatty infiltration the liver. No definite focal hepatic mass is identified by ultrasound.  IVC: Not well visualized  Pancreas: Not well visualized  Spleen: Size and appearance within normal limits.  Right Kidney: Length: 10.5 cm. Echogenicity within normal limits. No mass or hydronephrosis visualized.  Left Kidney: Length: 11.5 cm. Echogenicity within normal limits. No mass or hydronephrosis visualized.  Abdominal aorta: An infrarenal abdominal aortic aneurysm is visualized, measuring up to 4.9 cm AP diameter and 6.4 cm and craniocaudal span.  Other findings:  None.  IMPRESSION: 1. Cholelithiasis. Gallstones are present, including a 2.5 cm stone in the gallbladder neck. Gallbladder wall thickening is present, measuring up to 8.8 mm. Echogenic foci within the gallbladder wall with associated ring down artifact suggest adenomyomatosis of the gallbladder, which can be a cause of chronic wall thickening. In the setting of acute pain, the possibility of acute cholecystitis as a cause of wall thickening cannot be excluded. The sonographer states that the patient has a negative sonographic Murphy's sign at this time. 2. Infrarenal abdominal aortic aneurysm. This aneurysm appears increased in size since the CT of 02/24/2006. Please also see the CT chest abdomen pelvis performed yesterday, 08/15/2014. 3. Probable fatty infiltration of the liver. 4. Essentially nonvisualization of the inferior vena cava and pancreas. These results will be called to the ordering clinician or representative by the Radiologist Assistant, and communication documented in the PACS or zVision Dashboard.   Electronically Signed   By: Britta MccreedySusan  Turner M.D.   On: 08/16/2014 11:05    Anti-infectives: Anti-infectives    Start     Dose/Rate Route Frequency Ordered Stop   08/17/14 1800  cefTRIAXone (ROCEPHIN) 2 g in dextrose 5 % 50 mL IVPB - Premix    Comments:  Pharmacy may adjust dosing strength / duration / interval for maximal efficacy   2 g100 mL/hr over 30 Minutes Intravenous Every 24 hours 08/17/14 1544 08/17/14 1810   08/16/14 1515  cefTRIAXone (ROCEPHIN) 2 g in dextrose 5 % 50 mL IVPB - Premix  Status:  Discontinued    Comments:  Pharmacy may adjust dosing strength / duration / interval for maximal efficacy   2 g100 mL/hr over 30 Minutes Intravenous Every 24 hours 08/16/14 1504 08/17/14 1544      Assessment/Plan: s/p Procedure(s): LAPAROSCOPIC CHOLECYSTECTOMY WITH INTRAOPERATIVE CHOLANGIOGRAM (N/A)  Looks good Will give solids for lunch. If tolerates can d/c after lunch Needs IS -  spoke with nurse Cr better and trending down Discussed d/c instructions with pt.   Mary SellaEric M. Andrey CampanileWilson, MD, FACS General, Bariatric, & Minimally Invasive Surgery [facility] [place]   LOS: 3 days    Atilano InaWILSON,Ajanee Buren M 08/18/2014

## 2014-08-19 ENCOUNTER — Telehealth: Payer: Self-pay | Admitting: Surgery

## 2014-08-19 LAB — GLUCOSE, CAPILLARY
GLUCOSE-CAPILLARY: 191 mg/dL — AB (ref 70–99)
Glucose-Capillary: 157 mg/dL — ABNORMAL HIGH (ref 70–99)

## 2014-08-19 MED ORDER — OXYCODONE-ACETAMINOPHEN 5-325 MG PO TABS: 1.0000 | ORAL_TABLET | ORAL | Status: DC | PRN

## 2014-08-19 NOTE — Progress Notes (Signed)
Discharge instructions given. Pt verbalized understanding and all questions were answered.  

## 2014-08-19 NOTE — Progress Notes (Signed)
Patient  hs coverage of Insulin order was changed to ACHS  As patient diet order has been advanced to carbohydrate modified medium. Patient remains stable and resting.  Will continue to monitor patient.

## 2014-08-19 NOTE — Progress Notes (Signed)
Patient ID: Kevin DumasDavid L Tamburrino, male   DOB: [birth year], 79 y.o.   MRN: [id number]    Subjective:  Denies SSCP, palpitations or Dyspnea For d/c today ?  Objective:  Filed Vitals:   08/18/14 1325 08/18/14 1347 08/18/14 2042 08/19/14 0435  BP: 142/59  142/55 148/64  Pulse: 85  92 80  Temp: 99.3 F (37.4 C)  99.1 F (37.3 C) 97.4 F (36.3 C)  TempSrc: Oral  Oral Oral  Resp: 18  18 19   Height:      Weight:      SpO2: 88% 93% 93% 94%    Intake/Output from previous day:  Intake/Output Summary (Last 24 hours) at 08/19/14 [id number] Last data filed at 08/18/14 1700  Gross per 24 hour  Intake    480 ml  Output    150 ml  Net    330 ml    Physical Exam: Affect appropriate Chronically ill male  HEENT: normal Neck supple with no adenopathy JVP normal no bruits no thyromegaly Lungs clear with no wheezing and good diaphragmatic motion Heart:  S1/S2 no murmur, no rub, gallop or click PMI normal Abdomen: benighn, BS positve, no tenderness, no AAA SP gallbladder surgery no bruit.  No HSM or HJR S/P right AKA No edema Neuro non-focal Skin warm and dry No muscular weakness   Lab Results: Basic Metabolic Panel:  Recent Labs  11/91/4710/08/21 0339 08/18/14 0249  NA 137 136*  K 3.9 5.0  CL 99 98  CO2 25 26  GLUCOSE 140* 140*  BUN 18 17  CREATININE 1.68* 1.58*  CALCIUM 8.6 8.9   Liver Function Tests:  Recent Labs  08/17/14 0339  AST 16  ALT 12  ALKPHOS 38*  BILITOT 0.5  PROT 6.4  ALBUMIN 2.9*   No results for input(s): LIPASE, AMYLASE in the last 72 hours. CBC:  Recent Labs  08/17/14 0339 08/18/14 0249  WBC 11.1* 11.4*  NEUTROABS 6.5  --   HGB 12.2* 12.6*  HCT 36.9* 38.7*  MCV 90.0 89.4  PLT 207 210    Imaging: Dg Cholangiogram Operative  08/17/2014   CLINICAL DATA:  Laparoscopic cholecystectomy. Intraoperative cholangiogram to evaluate for gallstones.  EXAM: INTRAOPERATIVE CHOLANGIOGRAM  FLUOROSCOPY TIME:  7 seconds  COMPARISON:  Abdominal ultrasound -  08/16/2014 ; CT abdomen and pelvis - 02/24/2006  FINDINGS: Intraoperative angiographic images of the right upper abdominal quadrant during laparoscopic cholecystectomy are provided for review.  Surgical clips overlie the expected location of the gallbladder fossa.  Contrast injection demonstrates selective cannulation of the central aspect of the cystic duct.  There is passage of contrast through the central aspect of the cystic duct with filling of a non dilated common bile duct. There is passage of contrast though the CBD and into the descending portion of the duodenum.  There is minimal reflux of injected contrast into the common hepatic duct and central aspect of the non dilated intrahepatic biliary system.  There are no discrete filling defects within the opacified portions of the biliary system to suggest the presence of choledocholithiasis.  IMPRESSION: No evidence of choledocholithiasis.   Electronically Signed   By: Simonne ComeJohn  Watts M.D.   On: 08/17/2014 15:40    Cardiac Studies:  ECG:   SR no acute ST/ Twave changes    Telemetry:  NSR no arrhythmia  Echo:   Medications:   . docusate sodium  100 mg Oral BID  . enoxaparin (LOVENOX) injection  40 mg Subcutaneous Q24H  . insulin aspart  0-20  Units Subcutaneous 6 times per day  . metoprolol succinate  25 mg Oral BID  . pantoprazole  40 mg Oral Daily  . potassium chloride  20-40 mEq Oral Once        Assessment/Plan:  CAD:  Previous stent to LAD 2013  Stable no chest pain resume ASA continue beta blocker GB:  Wounds ok taking PO possible d/c home f/u general surgery  GERD continue pantoprazole  Charlton HawsPeter Trason Shifflet 08/19/2014, 9:25 AM

## 2014-08-19 NOTE — Telephone Encounter (Addendum)
-----   Message from Sharee PimpleMarilyn K McChesney, RN sent at 08/18/2014  9:43 AM EST ----- Regarding: Schedule He will need a AAA duplex, it put order in EPIC.   ----- Message -----    From: Raymond GurneyKimberly A Trinh, PA-C    Sent: 08/18/2014   8:22 AM      To: Vvs Charge Pool  Please see if this patient has an appointment with Dr. [name] in 6 months for AAA follow up.   Thanks, Kim  08/19/14: lm for pt re appt, dpm

## 2014-08-19 NOTE — Progress Notes (Addendum)
   [facility]  Subjective  - Some soreness, but better than yesterday.   Objective 148/64 80 97.4 F (36.3 C) (Oral) 19 94%  Intake/Output Summary (Last 24 hours) at 08/19/14 0810 Last data filed at 08/18/14 1700  Gross per 24 hour  Intake    480 ml  Output    350 ml  Net    130 ml   Abdomin soft, slight tenderness to palpation Heart RR Lungs CTA   Assessment/Planning: LAPAROSCOPIC CHOLECYSTECTOMY WITH INTRAOPERATIVE CHOLANGIOGRAM (N/A) AAA stable  Needs f/u US 6 months AAA and see Dr [name] Discharge home today   Clinton GallantCOLLINS, EMMA [facility] 08/19/2014 8:10 AM -- Agree with above.  D.c home Follow up Brabham 6 months  Fabienne Brunsharles Abbagayle Zaragoza, MD [facility] [place] Office: [phone] Pager: [phone]  Laboratory Lab Results:  Recent Labs  08/17/14 0339 08/18/14 0249  WBC 11.1* 11.4*  HGB 12.2* 12.6*  HCT 36.9* 38.7*  PLT 207 210   BMET  Recent Labs  08/17/14 0339 08/18/14 0249  NA 137 136*  K 3.9 5.0  CL 99 98  CO2 25 26  GLUCOSE 140* 140*  BUN 18 17  CREATININE 1.68* 1.58*  CALCIUM 8.6 8.9    COAG Lab Results  Component Value Date   INR 0.99 08/15/2014   INR 1.94* 10/20/2011   INR 1.10 10/19/2011   No results found for: PTT

## 2014-08-19 NOTE — Discharge Summary (Signed)
Patient ID: [name] MRN: [id number] DOB/AGE: 1934-12-29 79 y.o.  Admit date: 08/15/2014 Discharge date: 08/19/2014  Procedures: laparoscopic cholecystectomy with IOC  Consults: general surgery  Reason for Admission: Patient is a 79 y.o. year old male who presents for evaluation of AAA. Pt developed mid abdominal pain approximately 4 am today. It has been steady in character not worse or better. It is primarily midline abdominal pain. He does have a family history of AAA. His father died of AAA. Pt had normal BM today. He denies fever or chills, no syncope, no chest pain or dyspnea, no back pain. No nausea or vomiting. [facility] showed a single gallstone and 4.8 cm non ruptured AAA. Pt had CT several years ago which showed AAA 3.7 cm but then apparently was lost to follow up. Pt states no scans in several years. Pt has seen Dr [name] in the past and had Right Fem AT bypass and eventually right BKA in 2012. Other medical problems include diabetes, CAD (Dr Eden EmmsNishan), elevated cholesterol. Pt is minimally ambulatory on a prosthetic leg.  Admission Diagnoses:  1. Abdominal pain 2. AAA 3. Biliary colic 4. DM  Hospital Course: The patient was admitted to the vascular service for possible symptomatic AAA.  His symptoms did not seem c/w this though.  Therefore an abdominal US was obtained that revealed acute cholecystitis.  General surgery was consulted and we proceeded with a lap chole with IOC on HD 2.  The patient tolerated the procedure well.  His pain was controlled and he was tolerating on regular diet on POD 1, but desat slightly to 88% on RA.  He had some wheezing as well.  He was given a duoneb and placed on [place].  On POD 2, the patient was feeling better.  His O2 was weaned and he was stable for dc home.  PE: Abd: soft, appropriately tender, +BS, ND, incisions c/d/i Lungs: minimal expiratory wheeze on right side, otherwise CTA with some decrease BS at  bases.  Discharge Diagnoses:  Active Problems:   CAD (coronary artery disease)   AAA (abdominal aortic aneurysm)   Pre-operative cardiovascular examination acute cholecystitis, s/p lap chole with IOC  Discharge Medications:   Medication List    TAKE these medications        aspirin EC 81 MG tablet  Take 81 mg by mouth every evening.     clopidogrel 75 MG tablet  Commonly known as:  PLAVIX  Take 75 mg by mouth daily.     fenofibrate 160 MG tablet  Take 160 mg by mouth daily.     insulin glargine 100 UNIT/ML injection  Commonly known as:  LANTUS  Inject 40 Units into the skin at bedtime.     metoprolol succinate 25 MG 24 hr tablet  Commonly known as:  TOPROL-XL  Take 25 mg by mouth 2 (two) times daily.     NOVOLIN R RELION 100 units/mL injection  Generic drug:  insulin regular  25 Units 2 (two) times daily before a meal.     oxyCODONE-acetaminophen 5-325 MG per tablet  Commonly known as:  PERCOCET/ROXICET  Take 1-2 tablets by mouth every 4 (four) hours as needed for moderate pain.     simvastatin 40 MG tablet  Commonly known as:  ZOCOR  Take 40 mg by mouth at bedtime.        Discharge Instructions:     Follow-up Information    Follow up with [facility] [place]  On 09/13/2014.   Why:  1:45pm, arrive no later than 1:15pm for paperwork   Contact information:   [address]   [place] [address] [phone]       Follow up with Basilio CairoBRABHAM IV, Lala LundV. WELLS, MD In 6 months.   Specialty:  Vascular Surgery   Why:  [facility]   Contact information:   [address] [place] [address] [phone]       Signed: Letha CapeOSBORNE,Matisyn Cabeza E 08/19/2014, 8:24 AM

## 2014-08-21 NOTE — Addendum Note (Signed)
Addendum  created 08/21/14 1455 by Corky Soxhris Rasheedah Reis, MD   Modules edited: Anesthesia Events, Narrator   Narrator:  Narrator: Event Log Edited

## 2014-09-14 ENCOUNTER — Encounter (HOSPITAL_COMMUNITY): Payer: Self-pay | Admitting: Surgery

## 2015-02-17 ENCOUNTER — Encounter: Payer: Self-pay | Admitting: Surgery

## 2015-02-20 ENCOUNTER — Other Ambulatory Visit: Payer: Self-pay | Admitting: *Deleted

## 2015-02-20 ENCOUNTER — Ambulatory Visit (HOSPITAL_COMMUNITY)
Admission: RE | Admit: 2015-02-20 | Discharge: 2015-02-20 | Disposition: A | Discharge status: Home / Self Care | Payer: Commercial Managed Care - HMO | Source: Ambulatory Visit | Attending: Surgery | Admitting: Surgery

## 2015-02-20 ENCOUNTER — Encounter: Payer: Self-pay | Admitting: Surgery

## 2015-02-20 ENCOUNTER — Ambulatory Visit (INDEPENDENT_AMBULATORY_CARE_PROVIDER_SITE_OTHER): Payer: Commercial Managed Care - HMO | Admitting: Surgery

## 2015-02-20 VITALS — BP 153/64 | HR 72 | Ht 70.0 in | Wt 213.0 lb

## 2015-02-20 DIAGNOSIS — I714 Abdominal aortic aneurysm, without rupture, unspecified: Secondary | ICD-10-CM

## 2015-02-20 DIAGNOSIS — I6523 Occlusion and stenosis of bilateral carotid arteries: Secondary | ICD-10-CM

## 2015-02-20 DIAGNOSIS — I739 Peripheral vascular disease, unspecified: Secondary | ICD-10-CM

## 2015-02-20 NOTE — Progress Notes (Signed)
Patient name: [name] MRN: [id number] DOB: 06-05-1935 Sex: male     Chief Complaint  Patient presents with  . Re-evaluation    6 month f/u AAA    HISTORY OF PRESENT ILLNESS: Patient is back for follow-up.  He is well known to me having undergone right femoral popliteal bypass graft for a wound.  He ultimately required right below-knee amputation, performed by Dr. [name].  He did have some wound healing issues in the right groin.  He recently came to the hospital for abdominal pain.  An outside CT scan showed a 4.8 cm aneurysm and a large gallstone.  He underwent laparoscopic cholecystectomy and this resolved his pain.  He is back today for follow-up of his aneurysm.  He does have a history of aneurysmal disease in his family.  His father died of rupture.  Patient continues to be medically managed for hypercholesterolemia with a statin.  He is also treated for hypertension.  He has coronary artery disease and is status post LAD stent in 2013.  He is on Plavix.  He does have a history of MI  Past Medical History  Diagnosis Date  . Diabetes mellitus   . Hypercholesteremia   . Coronary artery disease 2012    [facility]  . AAA (abdominal aortic aneurysm)   . PAD (peripheral artery disease)     s/p right BKA  . Myocardial infarction     Past Surgical History  Procedure Laterality Date  . Spine surgery    . Fracture surgery  08-2011    ORIF right ankle  . Femoral-tibial bypass graft  09/27/2011    Procedure: BYPASS GRAFT FEMORAL-TIBIAL ARTERY;  Surgeon: Juleen ChinaV Wells Brabham, MD;  Location: [facility] OR;  Service: Vascular;  Laterality: Right;  Procedure started @1607           Femoral/Anterior Tibial Bypass right .  Right Femoral Endarterectomy.  . Tibia im nail insertion  09/27/2011    Procedure: INTRAMEDULLARY (IM) NAIL TIBIAL;  Surgeon: Nadara MustardMarcus V Duda, MD;  Location: [facility] OR;  Service: Orthopedics;  Laterality: Right;  Procedure started @1510 -1536 in addition: Removal of  Hardware Right Fibula  . Back surgery  1990's  . Amputation  10/18/2011    Procedure: AMPUTATION BELOW KNEE;  Surgeon: Nadara MustardMarcus V Duda, MD;  Location: [facility] OR;  Service: Orthopedics;  Laterality: Right;  Right Below Knee Amputation  . Appendectomy      Open - high school  . Cholecystectomy N/A 08/17/2014    Procedure: LAPAROSCOPIC CHOLECYSTECTOMY WITH INTRAOPERATIVE CHOLANGIOGRAM;  Surgeon: Atilano InaEric M Wilson, MD;  [facility];  Service: General;  Laterality: N/A;  . Lower extremity angiogram N/A 09/24/2011    Procedure: LOWER EXTREMITY ANGIOGRAM;  Surgeon: Nada LibmanVance W Brabham, MD;  [facility];  Service: Cardiovascular;  Laterality: N/A;    History   Social History  . Marital Status: Married    Spouse Name: N/A  . Number of Children: N/A  . Years of Education: N/A   Occupational History  . Retired    Social History Main Topics  . Smoking status: Former Games developermoker  . Smokeless tobacco: Never Used     Comment: 08/17/2014 " I have not smoked in 30 years "  . Alcohol Use: Yes     Comment: DRINKS WINE  . Drug Use: No  . Sexual Activity: Not on file   Other Topics Concern  . Not on file   Social History Narrative  Family History  Problem Relation Age of Onset  . Diabetes Other   . Coronary artery disease Neg Hx   . Cancer Mother   . Diabetes Mother   . Varicose Veins Father   . AAA (abdominal aortic aneurysm) Father     Allergies as of 02/20/2015  . (No Known Allergies)    Current Outpatient Prescriptions on File Prior to Visit  Medication Sig Dispense Refill  . aspirin EC 81 MG tablet Take 81 mg by mouth every evening.      . clopidogrel (PLAVIX) 75 MG tablet Take 75 mg by mouth daily.    . fenofibrate 160 MG tablet Take 160 mg by mouth daily.    . insulin glargine (LANTUS) 100 UNIT/ML injection Inject 25 Units into the skin at bedtime.     . metoprolol succinate (TOPROL-XL) 25 MG 24 hr tablet Take 25 mg by mouth 2 (two) times daily.    [name]. NOVOLIN R RELION 100  UNIT/ML injection 15 Units 2 (two) times daily before a meal.     . simvastatin (ZOCOR) 40 MG tablet Take 40 mg by mouth at bedtime.      [name]. oxyCODONE-acetaminophen (PERCOCET/ROXICET) 5-325 MG per tablet Take 1-2 tablets by mouth every 4 (four) hours as needed for moderate pain. (Patient not taking: Reported on 02/20/2015) 30 tablet 0   No current facility-administered medications on file prior to visit.     REVIEW OF SYSTEMS: Cardiovascular: No chest pain, chest pressure, palpitations, orthopnea, or dyspnea on exertion. No claudication or rest pain,  No history of DVT or phlebitis. Pulmonary: No productive cough, asthma or wheezing. Neurologic: No weakness, paresthesias, aphasia, or amaurosis. No dizziness. Hematologic: No bleeding problems or clotting disorders. Musculoskeletal: back pain Gastrointestinal: No blood in stool or hematemesis Genitourinary: No dysuria or hematuria. Psychiatric:: No history of major depression. Integumentary: No rashes or ulcers. Constitutional: No fever or chills.  PHYSICAL EXAMINATION:   Vital signs are  Filed Vitals:   02/20/15 0947 02/20/15 0952  BP: 167/71 153/64  Pulse: 72   Height: 5\' 10"  (1.778 m)   Weight: 213 lb (96.616 kg)   SpO2: 98%    Body mass index is 30.56 kg/(m^2). General: The patient appears their stated age. HEENT:  No gross abnormalities Pulmonary:  Non labored breathing Abdomen: Soft and non-tender Musculoskeletal: right below-knee amputation. Neurologic: No focal weakness or paresthesias are detected, Skin: There are no ulcer or rashes noted. Psychiatric: The patient has normal affect. Cardiovascular: There is a regular rate and rhythm without significant murmur appreciated. .  Aorta not palpable  Diagnostic Studies I have ordered and reviewed his abdominal ultrasound shows a aneurysm and his infrarenal abdominal aorta with maximum diameter of 4.9 cm  Assessment: Abdominal aortic aneurysm Plan: I discussed the signs  and symptoms of rupture and to come to the hospital should these occur.  I am scheduling him to follow up in 6 months with a CT angiogram of the abdomen and pelvis.  I will also get preoperative ultrasound images including carotid duplex, ABIs and lower extremity duplex to look for a left popliteal aneurysm.  Jorge NyV. Wells Brabham IV, M.D. [facility] [place] Office: [phone] Pager:  [phone]

## 2015-02-24 NOTE — Progress Notes (Signed)
Patient ID: Kevin DumasDavid L Sharp, male   DOB: [birth year], 80 y.o.   MRN: [id number] 80 y.o.  Initially  referred by Dr Yetta FlockHodges for CAD 2013  . [facility] bad experience [facility] and [place]. History of chest pain with CAD. Had stent placed to LAD in [place] by Dr Rhona Leavenshiu in July of 2013. No chest pain since. Diabetic. 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 Difficulty getting around with prosthetic device. No dyspnea palpitation or syncope. Compliant with meds Has had f/u with Dr [name] for PVD.   [place] Hodges [place]  Some renal failure related to ? ACE or metformin now resolved Had lap choly 08/2014  Presenting with abdominal pain CT also showed 4.9 cm AAA followed by Dr [name]  F/U CTA, duplex and ABI's scheduled by him   HR high in office today  Appeared to be atrial tachycardia or flutter HR 100 with rhythm strip and CSM HR slowed to 70-80 then spead up not classic flutter  More like atrial tachycardia  ROS: Denies fever, malais, weight loss, blurry vision, decreased visual acuity, cough, sputum, SOB, hemoptysis, pleuritic pain, palpitaitons, heartburn, abdominal pain, melena, lower extremity edema, claudication, or rash.  All other systems reviewed and negative  General: Affect appropriate Over weight white male  HEENT: normal Neck supple with no adenopathy JVP normal no bruits no thyromegaly Lungs clear with no wheezing and good diaphragmatic motion Heart:  S1/S2 no murmur, no rub, gallop or click PMI normal Abdomen: benighn, BS positve, no tenderness, AAA s/p lapcholy no bruit.  No HSM or HJR S/P right BKA  No edema Neuro non-focal Skin warm and dry No muscular weakness   Current Outpatient Prescriptions  Medication Sig Dispense Refill  . aspirin EC 81 MG tablet Take 81 mg by mouth every evening.      . clopidogrel (PLAVIX) 75 MG tablet Take 75 mg by mouth daily.    . fenofibrate 160 MG tablet Take 160 mg by mouth  daily.    [name]. glimepiride (AMARYL) 2 MG tablet Take 2 mg by mouth daily with breakfast.    . insulin glargine (LANTUS) 100 UNIT/ML injection Inject 25 Units into the skin at bedtime.     . metoprolol succinate (TOPROL-XL) 25 MG 24 hr tablet Take 25 mg by mouth 2 (two) times daily.    [name]. NOVOLIN R RELION 100 UNIT/ML injection 15 Units 2 (two) times daily before a meal.     . oxyCODONE-acetaminophen (PERCOCET/ROXICET) 5-325 MG per tablet Take 1-2 tablets by mouth every 4 (four) hours as needed for moderate pain. 30 tablet 0  . simvastatin (ZOCOR) 40 MG tablet Take 40 mg by mouth at bedtime.       No current facility-administered medications for this visit.    Allergies  Review of patient's allergies indicates no known allergies.  Electrocardiogram: 8/14 SR rate 72 RBBB LAFB  02/27/15  Atrial tachycardia rate 101  RBBB LAFB possible old anterior MI Rhythm strip with CSM atrial tachycardia with slowing of rate to 75 range no flutter waves seen not sinus   Assessment and Plan Atrial Arrhythmia:  See above including results or rhythm strip and CSM.  Reviewed with Dr [name] in office today EP.  Agrees this is likely an atrial tachycardia No need for anticoagulation.  Add Cardizem CD 120 mg daily  F/U with Dr [name] may benefit from mapping/ ablation  CAD:  Stent to LAD  in 2013  No angina stable continue asa, beta blocker and plavix  AAA:  4.9 cm f/u CTA per Dr [name]  PVD: post amputation RLE BKA  Ambulates with prosthesis well  ABI's with Dr [name] LLE   Chol:  Cholesterol is at goal.  Continue current dose of statin and diet Rx.  No myalgias or side effects.  F/U  LFT's in 6 months. No results found for: LDLCALC         F/U with Dr [name] next available

## 2015-02-27 ENCOUNTER — Encounter: Payer: Self-pay | Admitting: Cardiovascular Disease

## 2015-02-27 ENCOUNTER — Telehealth: Payer: Self-pay | Admitting: *Deleted

## 2015-02-27 ENCOUNTER — Ambulatory Visit (INDEPENDENT_AMBULATORY_CARE_PROVIDER_SITE_OTHER): Payer: Commercial Managed Care - HMO | Admitting: Cardiovascular Disease

## 2015-02-27 VITALS — BP 100/70 | HR 101 | Ht 70.0 in | Wt 209.8 lb

## 2015-02-27 DIAGNOSIS — I471 Supraventricular tachycardia: Secondary | ICD-10-CM

## 2015-02-27 MED ORDER — DILTIAZEM HCL ER COATED BEADS 120 MG PO CP24: 120.0000 mg | ORAL_CAPSULE | Freq: Every day | ORAL | Status: AC

## 2015-02-27 NOTE — Patient Instructions (Signed)
Medication Instructions:  WILL CALL  WITH MED  CHANGES  Labwork: NONE  Testing/Procedures:  NONEFollow-Up: Your physician recommends that you schedule a follow-up appointment in:  NEXT  AVAILABLE WITH   EP   FOR   A FLUTTER  Any Other Special Instructions Will Be Listed Below (If Applicable).

## 2015-02-27 NOTE — Telephone Encounter (Signed)
PER  DR NISHAN PT  TO  START  ON CARDIZEM  CD  120 MG EVERY DAY  AND SEE DR [name]  ASAP .Zack Seal/CY

## 2015-03-01 ENCOUNTER — Institutional Professional Consult (permissible substitution): Payer: Commercial Managed Care - HMO | Admitting: Internal Medicine

## 2015-03-02 ENCOUNTER — Telehealth: Payer: Self-pay

## 2015-03-02 ENCOUNTER — Institutional Professional Consult (permissible substitution): Payer: Commercial Managed Care - HMO | Admitting: Internal Medicine

## 2015-03-02 NOTE — Telephone Encounter (Signed)
Keep cardizem 120 for atrial tachycardia and change zocor to generic lipitor 20 mg

## 2015-03-03 ENCOUNTER — Other Ambulatory Visit: Payer: Self-pay

## 2015-03-03 ENCOUNTER — Telehealth: Payer: Self-pay

## 2015-03-03 MED ORDER — ATORVASTATIN CALCIUM 20 MG PO TABS: 20.0000 mg | ORAL_TABLET | Freq: Every day | ORAL | Status: AC

## 2015-03-03 NOTE — Telephone Encounter (Signed)
PT  NOTIFIED ./CY 

## 2015-03-03 NOTE — Telephone Encounter (Signed)
Called patient to let him know of the change from simvastatin 40 mg to lipitor 20 mg

## 2015-03-03 NOTE — Telephone Encounter (Signed)
Change to simvastatin 40 mg to lipitor 20 per Dr Bevely PalmerNiashan because of a drug interaction

## 2015-03-08 ENCOUNTER — Encounter: Payer: Self-pay | Admitting: Internal Medicine

## 2015-03-08 ENCOUNTER — Ambulatory Visit (INDEPENDENT_AMBULATORY_CARE_PROVIDER_SITE_OTHER): Payer: Commercial Managed Care - HMO | Admitting: Internal Medicine

## 2015-03-08 ENCOUNTER — Other Ambulatory Visit: Payer: Self-pay

## 2015-03-08 VITALS — BP 144/68 | HR 80 | Ht 70.5 in | Wt 214.6 lb

## 2015-03-08 DIAGNOSIS — I471 Supraventricular tachycardia: Secondary | ICD-10-CM

## 2015-03-08 DIAGNOSIS — I2510 Atherosclerotic heart disease of native coronary artery without angina pectoris: Secondary | ICD-10-CM

## 2015-03-08 DIAGNOSIS — I452 Bifascicular block: Secondary | ICD-10-CM | POA: Diagnosis not present

## 2015-03-08 DIAGNOSIS — I2583 Coronary atherosclerosis due to lipid rich plaque: Secondary | ICD-10-CM

## 2015-03-08 NOTE — Patient Instructions (Signed)
Medication Instructions:  No changes were made to your medications today   Labwork: NONE ORDERED  Testing/Procedures: NONE ORDERED  Follow-Up:  Your physician wants you to follow-up: As needed with Dr. [name].     Any Other Special Instructions Will Be Listed Below (If Applicable).

## 2015-03-10 DIAGNOSIS — I452 Bifascicular block: Secondary | ICD-10-CM | POA: Insufficient documentation

## 2015-03-10 DIAGNOSIS — I471 Supraventricular tachycardia: Secondary | ICD-10-CM | POA: Insufficient documentation

## 2015-03-10 NOTE — Progress Notes (Signed)
Electrophysiology Office Note   Date:  03/10/2015   ID:  Kevin DumasDavid L Spease, DOB [birth year], MRN [id number]  PCP:  Charlott RakesHODGES,FRANCISCO, MD  Cardiologist:  Dr Eden EmmsNishan Primary Electrophysiologist: Hillis RangeJames Juaquina Machnik, MD    Chief Complaint  Patient presents with  . Atrial Flutter  . ATACH     History of Present Illness: [name] is a 80 y.o. male who presents today for electrophysiology evaluation.   The patient presents for EP evaluation of recently observed long RP tachycardia.  He presented to Dr Ricki MillerNishans office for routine follow-up and was noted to have tachycardia.  Carotid massage was able to demonstrate 2:1 slowing and suggested underlying atrial tachycardia.  He was placed on diltiazem.  He was unaware of his arrhythmia at that time and has done well since.  He is asymptomatic today.   Today, he denies symptoms of palpitations, chest pain, shortness of breath, orthopnea, PND, lower extremity edema, claudication, dizziness, presyncope, syncope, bleeding, or neurologic sequela. The patient is tolerating medications without difficulties and is otherwise without complaint today.    Past Medical History  Diagnosis Date  . Diabetes mellitus   . Hypercholesteremia   . Coronary artery disease 2012    [place]  . AAA (abdominal aortic aneurysm)   . PAD (peripheral artery disease)     s/p right BKA  . Myocardial infarction    Past Surgical History  Procedure Laterality Date  . Spine surgery    . Fracture surgery  08-2011    ORIF right ankle  . Femoral-tibial bypass graft  09/27/2011    Procedure: BYPASS GRAFT FEMORAL-TIBIAL ARTERY;  Surgeon: Juleen ChinaV Wells Brabham, MD;  Location: [facility] OR;  Service: Vascular;  Laterality: Right;  Procedure started @1607           Femoral/Anterior Tibial Bypass right .  Right Femoral Endarterectomy.  . Tibia im nail insertion  09/27/2011    Procedure: INTRAMEDULLARY (IM) NAIL TIBIAL;  Surgeon: Nadara MustardMarcus V Duda, MD;  Location: [facility] OR;  Service:  Orthopedics;  Laterality: Right;  Procedure started @1510 -1536 in addition: Removal of Hardware Right Fibula  . Back surgery  1990's  . Amputation  10/18/2011    Procedure: AMPUTATION BELOW KNEE;  Surgeon: Nadara MustardMarcus V Duda, MD;  Location: [facility] OR;  Service: Orthopedics;  Laterality: Right;  Right Below Knee Amputation  . Appendectomy      Open - high school  . Cholecystectomy N/A 08/17/2014    Procedure: LAPAROSCOPIC CHOLECYSTECTOMY WITH INTRAOPERATIVE CHOLANGIOGRAM;  Surgeon: Atilano InaEric M Wilson, MD;  [facility];  Service: General;  Laterality: N/A;  . Lower extremity angiogram N/A 09/24/2011    Procedure: LOWER EXTREMITY ANGIOGRAM;  Surgeon: Nada LibmanVance W Brabham, MD;  [facility];  Service: Cardiovascular;  Laterality: N/A;     Current Outpatient Prescriptions  Medication Sig Dispense Refill  . aspirin EC 81 MG tablet Take 81 mg by mouth every evening.      [name]. atorvastatin (LIPITOR) 20 MG tablet Take 1 tablet (20 mg total) by mouth daily. 90 tablet 3  . clopidogrel (PLAVIX) 75 MG tablet Take 75 mg by mouth daily.    [name]. diltiazem (CARDIZEM CD) 120 MG 24 hr capsule Take 1 capsule (120 mg total) by mouth daily. 90 capsule 3  . fenofibrate 160 MG tablet Take 160 mg by mouth daily.    [name]. glimepiride (AMARYL) 2 MG tablet Take 2 mg by mouth daily with breakfast.    . insulin glargine (LANTUS) 100 UNIT/ML  injection Inject 25 Units into the skin at bedtime.     . metoprolol succinate (TOPROL-XL) 25 MG 24 hr tablet Take 25 mg by mouth 2 (two) times daily.    [name] NOVOLIN R RELION 100 UNIT/ML injection Inject 15 Units into the skin 2 (two) times daily before a meal.      No current facility-administered medications for this visit.    Allergies:   Review of patient's allergies indicates no known allergies.   Social History:  The patient  reports that he has quit smoking. He has never used smokeless tobacco. He reports that he drinks alcohol. He reports that he does not use illicit drugs.   Family  History:  The patient's  family history includes AAA (abdominal aortic aneurysm) in his father; Cancer in his mother; Diabetes in his mother and other; Varicose Veins in his father. There is no history of Coronary artery disease.    ROS:  Please see the history of present illness.   All other systems are reviewed and negative.    PHYSICAL EXAM: VS:  BP 144/68 mmHg  Pulse 80  Ht 5' 10.5" (1.791 m)  Wt 97.342 kg (214 lb 9.6 oz)  BMI 30.35 kg/m2 , BMI Body mass index is 30.35 kg/(m^2). GEN: Well nourished, well developed, in no acute distress HEENT: normal Neck: no JVD, carotid bruits, or masses Cardiac: RRR; no murmurs, rubs, or gallops,no edema  Respiratory:  clear to auscultation bilaterally, normal work of breathing GI: soft, nontender, nondistended, + BS MS: no deformity or atrophy Skin: warm and dry  Neuro:  Strength and sensation are intact Psych: euthymic mood, full affect  EKG:  EKG is ordered today. The ekg ordered today shows sinus rhythm with bifascicular block   Recent Labs: 08/17/2014: ALT 12 08/18/2014: BUN 17; Creatinine 1.58*; Hemoglobin 12.6*; Platelets 210; Potassium 5.0; Sodium 136*    Lipid Panel  No results found for: CHOL, TRIG, HDL, CHOLHDL, VLDL, LDLCALC, LDLDIRECT   Wt Readings from Last 3 Encounters:  03/08/15 97.342 kg (214 lb 9.6 oz)  02/27/15 95.165 kg (209 lb 12.8 oz)  02/20/15 96.616 kg (213 lb)      Other studies Reviewed: Additional studies/ records that were reviewed today include: Dr Ricki Miller notes and ekgs (as above)   ASSESSMENT AND PLAN:  1.  Long RP SVT I suspect atrial tachycardia as above.  Sinus tach would be less likely Asymptomatic and appears resolved with low dose diltiazem He would like to avoid ablation.  Given his advanced age and baseline conduction system disease (see below) I think that it would be best to avoid ablation if possible. No changes at this time Would be careful with AV nodal agents given bifascicular  block  2. Bifascicular block Asymptomatic Caution with AV nodal agents No indicatio for pacing att his time  3. CAD No ischemic symptoms No changes  Current medicines are reviewed at length with the patient today.   The patient does not have concerns regarding his medicines.  The following changes were made today:  none   Follow-up with Dr Eden Emms as scheduled I will see as needed going forward   Signed, Hillis Range, MD  03/10/2015 12:19 AM     [facility] [address] [place] [address] [phone] (office) [phone] (fax)

## 2015-03-15 ENCOUNTER — Institutional Professional Consult (permissible substitution): Payer: Commercial Managed Care - HMO | Admitting: Internal Medicine

## 2015-08-18 ENCOUNTER — Other Ambulatory Visit: Payer: Self-pay | Admitting: *Deleted

## 2015-08-18 DIAGNOSIS — Z01812 Encounter for preprocedural laboratory examination: Secondary | ICD-10-CM

## 2015-08-21 ENCOUNTER — Ambulatory Visit
Admission: RE | Admit: 2015-08-21 | Discharge: 2015-08-21 | Disposition: A | Discharge status: Home / Self Care | Payer: Commercial Managed Care - HMO | Source: Ambulatory Visit | Attending: Surgery | Admitting: Surgery

## 2015-08-21 DIAGNOSIS — I714 Abdominal aortic aneurysm, without rupture, unspecified: Secondary | ICD-10-CM

## 2015-08-21 MED ORDER — IOPAMIDOL (ISOVUE-370) INJECTION 76%
75.0000 mL | Freq: Once | INTRAVENOUS | Status: AC | PRN
  Administered 2015-08-21: 75 mL via INTRAVENOUS

## 2015-08-28 ENCOUNTER — Ambulatory Visit (HOSPITAL_COMMUNITY)
Admission: RE | Admit: 2015-08-28 | Discharge: 2015-08-28 | Disposition: A | Discharge status: Home / Self Care | Payer: Commercial Managed Care - HMO | Source: Ambulatory Visit | Attending: Surgery | Admitting: Surgery

## 2015-08-28 ENCOUNTER — Ambulatory Visit (INDEPENDENT_AMBULATORY_CARE_PROVIDER_SITE_OTHER)
Admission: RE | Admit: 2015-08-28 | Discharge: 2015-08-28 | Disposition: A | Discharge status: Home / Self Care | Payer: Commercial Managed Care - HMO | Source: Ambulatory Visit | Attending: Surgery | Admitting: Surgery

## 2015-08-28 ENCOUNTER — Ambulatory Visit (INDEPENDENT_AMBULATORY_CARE_PROVIDER_SITE_OTHER)
Admission: RE | Admit: 2015-08-28 | Discharge: 2015-08-28 | Disposition: A | Discharge status: Home / Self Care | Payer: Commercial Managed Care - HMO | Source: Ambulatory Visit | Attending: Vascular Surgery | Admitting: Vascular Surgery

## 2015-08-28 ENCOUNTER — Ambulatory Visit: Payer: Commercial Managed Care - HMO | Admitting: Surgery

## 2015-08-28 DIAGNOSIS — I6521 Occlusion and stenosis of right carotid artery: Secondary | ICD-10-CM | POA: Insufficient documentation

## 2015-08-28 DIAGNOSIS — I739 Peripheral vascular disease, unspecified: Secondary | ICD-10-CM | POA: Diagnosis present

## 2015-08-28 DIAGNOSIS — I6523 Occlusion and stenosis of bilateral carotid arteries: Secondary | ICD-10-CM | POA: Diagnosis not present

## 2015-08-28 DIAGNOSIS — E119 Type 2 diabetes mellitus without complications: Secondary | ICD-10-CM | POA: Insufficient documentation

## 2015-08-28 DIAGNOSIS — I70202 Unspecified atherosclerosis of native arteries of extremities, left leg: Secondary | ICD-10-CM | POA: Insufficient documentation

## 2015-08-28 DIAGNOSIS — E7800 Pure hypercholesterolemia, unspecified: Secondary | ICD-10-CM | POA: Diagnosis not present

## 2015-08-29 ENCOUNTER — Encounter: Payer: Self-pay | Admitting: Surgery

## 2015-09-04 ENCOUNTER — Ambulatory Visit (INDEPENDENT_AMBULATORY_CARE_PROVIDER_SITE_OTHER): Payer: Commercial Managed Care - HMO | Admitting: Surgery

## 2015-09-04 ENCOUNTER — Encounter: Payer: Self-pay | Admitting: Surgery

## 2015-09-04 VITALS — BP 179/64 | HR 71 | Ht 70.5 in | Wt 217.9 lb

## 2015-09-04 DIAGNOSIS — I714 Abdominal aortic aneurysm, without rupture, unspecified: Secondary | ICD-10-CM

## 2015-09-04 NOTE — Progress Notes (Signed)
Patient name: [name] MRN: [id number] DOB: 12/14/1934 Sex: male     Chief Complaint  Patient presents with  . Re-evaluation    6 month f/u     HISTORY OF PRESENT ILLNESS:  the patient is here for follow-up.  He is well known to me, having undergone right femoral-popliteal bypass graft for nonhealing wound.  Unfortunately, he ended up requiring a below-knee amputation which was performed by Dr. Lajoyce Corners. A while back, he went to the hospital for abdominal pain and had a CT scan which shows a 4. 8 cm abdominal aortic aneurysm.  He does have a history of aneurysmal changes in his family.  His father died of a rupture. He currently denies any abdominal pain or back pain.  Patient continues to be medically managed for hypercholesterolemia with a statin. He is also treated for hypertension. He has coronary artery disease and is status post LAD stent in 2013. He is on Plavix. He does have a history of MI  Past Medical History  Diagnosis Date  . Diabetes mellitus   . Hypercholesteremia   . Coronary artery disease 2012    LAD stent 2013 [facility]  . AAA (abdominal aortic aneurysm) (HCC)   . PAD (peripheral artery disease) (HCC)     s/p right BKA  . Myocardial infarction [facility])     Past Surgical History  Procedure Laterality Date  . Spine surgery    . Fracture surgery  08-2011    ORIF right ankle  . Femoral-tibial bypass graft  09/27/2011    Procedure: BYPASS GRAFT FEMORAL-TIBIAL ARTERY;  Surgeon: Juleen China, MD;  Location: [facility] OR;  Service: Vascular;  Laterality: Right;  Procedure started           Femoral/Anterior Tibial Bypass right .  Right Femoral Endarterectomy.  . Tibia im nail insertion  09/27/2011    Procedure: INTRAMEDULLARY (IM) NAIL TIBIAL;  Surgeon: Nadara Mustard, MD;  Location: [facility] OR;  Service: Orthopedics;  Laterality: Right;  Procedure started -1536 in addition: Removal of Hardware Right Fibula  . Back surgery  1990's  . Amputation  10/18/2011    Procedure: AMPUTATION BELOW KNEE;  Surgeon: Nadara Mustard, MD;  Location: [facility] OR;  Service: Orthopedics;  Laterality: Right;  Right Below Knee Amputation  . Appendectomy      Open - high school  . Cholecystectomy N/A 08/17/2014    Procedure: LAPAROSCOPIC CHOLECYSTECTOMY WITH INTRAOPERATIVE CHOLANGIOGRAM;  Surgeon: Atilano Ina, MD;  Location: [facility] OR;  Service: General;  Laterality: N/A;  . Lower extremity angiogram N/A 09/24/2011    Procedure: LOWER EXTREMITY ANGIOGRAM;  Surgeon: Nada Libman, MD;  [facility] C[facility] LAB;  Service: Cardiovascular;  Laterality: N/A;    Social History   Social History  . Marital Status: Married    Spouse Name: N/A  . Number of Children: N/A  . Years of Education: N/A   Occupational History  . Retired    Social History Main Topics  . Smoking status: Former Games developer  . Smokeless tobacco: Never Used     Comment: 08/17/2014 " I have not smoked in 30 years "  . Alcohol Use: Yes     Comment: DRINKS WINE  . Drug Use: No  . Sexual Activity: Not on file   Other Topics Concern  . Not on file   Social History Narrative    Family History  Problem Relation Age of Onset  . Diabetes Other   . Coronary  artery disease Neg Hx   . Cancer Mother   . Diabetes Mother   . Varicose Veins Father   . AAA (abdominal aortic aneurysm) Father     Allergies as of 09/04/2015  . (No Known Allergies)    Current Outpatient Prescriptions on File Prior to Visit  Medication Sig Dispense Refill  . aspirin EC 81 MG tablet Take 81 mg by mouth every evening.      [name]. atorvastatin (LIPITOR) 20 MG tablet Take 1 tablet (20 mg total) by mouth daily. 90 tablet 3  . clopidogrel (PLAVIX) 75 MG tablet Take 75 mg by mouth daily.    [name]. diltiazem (CARDIZEM CD) 120 MG 24 hr capsule Take 1 capsule (120 mg total) by mouth daily. 90 capsule 3  . fenofibrate 160 MG tablet Take 160 mg by mouth daily.    [name]. glimepiride (AMARYL) 2 MG tablet Take 2 mg by mouth daily with breakfast.    .  insulin glargine (LANTUS) 100 UNIT/ML injection Inject 25 Units into the skin at bedtime.     . metoprolol succinate (TOPROL-XL) 25 MG 24 hr tablet Take 25 mg by mouth 2 (two) times daily.    [name]. NOVOLIN R RELION 100 UNIT/ML injection Inject 15 Units into the skin 2 (two) times daily before a meal.      No current facility-administered medications on file prior to visit.     REVIEW OF SYSTEMS: Cardiovascular: No chest pain, chest pressure, palpitations, orthopnea, or dyspnea on exertion. No claudication or rest pain,  No history of DVT or phlebitis. Pulmonary: No productive cough, asthma or wheezing. Neurologic: No weakness, paresthesias, aphasia, or amaurosis. No dizziness. Hematologic: No bleeding problems or clotting disorders. Musculoskeletal:  Right leg amputation. Gastrointestinal: No blood in stool or hematemesis Genitourinary: No dysuria or hematuria. Psychiatric:: No history of major depression. Integumentary: No rashes or ulcers. Constitutional: No fever or chills.  PHYSICAL EXAMINATION:   Vital signs are  Filed Vitals:   09/04/15 1014 09/04/15 1015  BP: 176/71 179/64  Pulse: 71   Height: 5' 10.5" (1.791 m)   Weight: 217 lb 14.4 oz (98.839 kg)   SpO2: 97%    Body mass index is 30.81 kg/(m^2). General: The patient appears their stated age. HEENT:  No gross abnormalities Pulmonary:  Non labored breathing Abdomen: Soft and non-tender Musculoskeletal: There are no major deformities. Neurologic: No focal weakness or paresthesias are detected, Skin: There are no ulcer or rashes noted. Psychiatric: The patient has normal affect. Cardiovascular: There is a regular rate and rhythm without significant murmur appreciated. No carotid bruits   Diagnostic Studies  I have reviewed his carotid duplex study which shows less than 39% stenosis bilaterally.  Lower extremity vascular study showed no aneurysmal changes in the popliteal artery, however he does have diffuse disease  throughout the left leg.  CT angiogram shows infrarenal abdominal aortic aneurysm with maximum diameter 5.1 cm  Assessment:  abdominal aortic aneurysm Plan:  the patient's aneurysm is relatively straightforward to 6 from an anatomic perspective, however he does have significant issues in his groin and an occluded right common femoral artery which will make access very difficult. I discussed this with the patient and his wife.  Because of the potential access complications, I would like to wait for his aneurysm to greater than 5.5 cm.  If however he has shown significant growth , 2-3 millimeters in 6 months, I will consider proceeding with repair. He will follow-up with me with a repeat CT scan in  6 months.  Jorge Ny, M.D. [facility] Office: [phone] Pager:  [phone]

## 2015-09-04 NOTE — Addendum Note (Signed)
Addended by: Adria DillELDRIDGE-LEWIS, Berman Grainger L on: 09/04/2015 01:26 PM   Modules accepted: Orders

## 2016-02-05 DEATH — deceased

## 2016-02-08 ENCOUNTER — Telehealth: Payer: Self-pay | Admitting: Family

## 2016-02-08 NOTE — Telephone Encounter (Signed)
-----   Message from Renato GailsJennifer L Arrington sent at 02/08/2016 10:13 AM EDT ----- Regarding: Patient deceased Patient died on 01-23-16, per wife.

## 2016-03-05 ENCOUNTER — Other Ambulatory Visit: Payer: Commercial Managed Care - HMO

## 2016-03-11 ENCOUNTER — Ambulatory Visit: Payer: Commercial Managed Care - HMO | Admitting: Surgery

## 2016-05-17 IMAGING — CT CT CTA ABD/PEL W/CM AND/OR W/O CM
1 of 7 series · 12 of 46 positions shown, 18 images · IV contrast (75CC ISOVUE 370)
Comparison: 08/15/2014

CLINICAL DATA: Abdominal aortic annular

EXAM:
CTA ABDOMEN AND PELVIS wITHOUT AND WITH CONTRAST
TECHNIQUE: Multidetector CT imaging of the abdomen and pelvis was performed
using the standard protocol during bolus administration of
intravenous contrast. Multiplanar reconstructed images and MIPs were
obtained and reviewed to evaluate the vascular anatomy.
CONTRAST:  75 cc Isovue 370

[Series 4: angio (id) · axial · 0.81mm/px · z∈[-466,-56]mm · 12 of 192 slices shown, 18 images]
[im 14/192  soft-tissue]
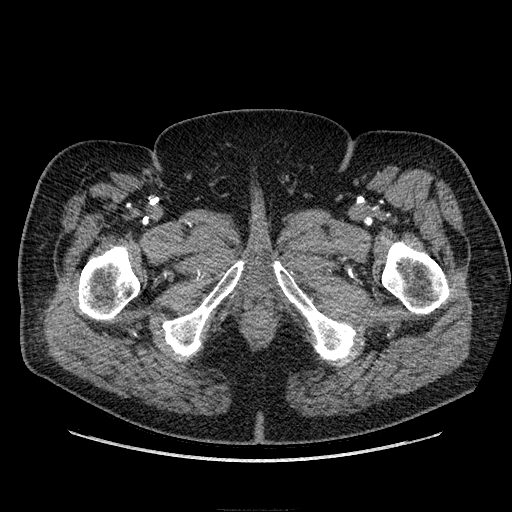
[im 14/192  bone]
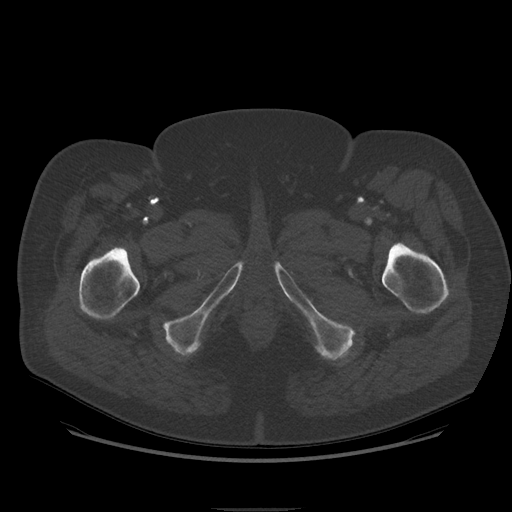
[im 28/192  soft-tissue]
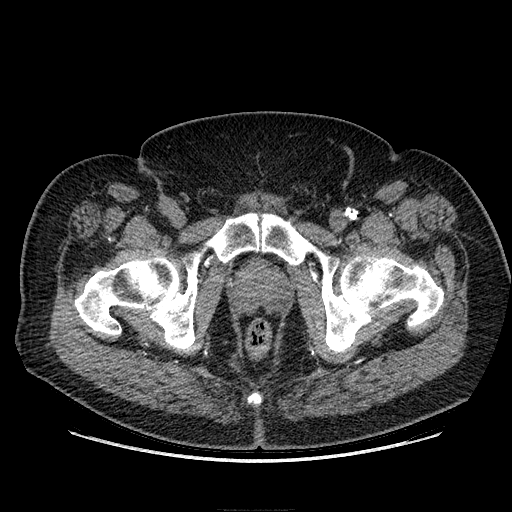
[im 41/192  soft-tissue]
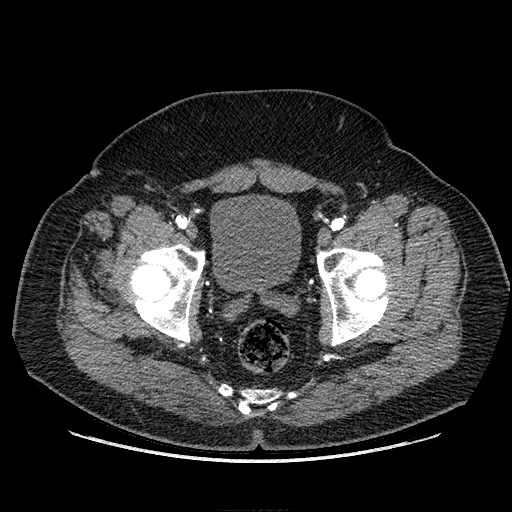
[im 55/192  soft-tissue]
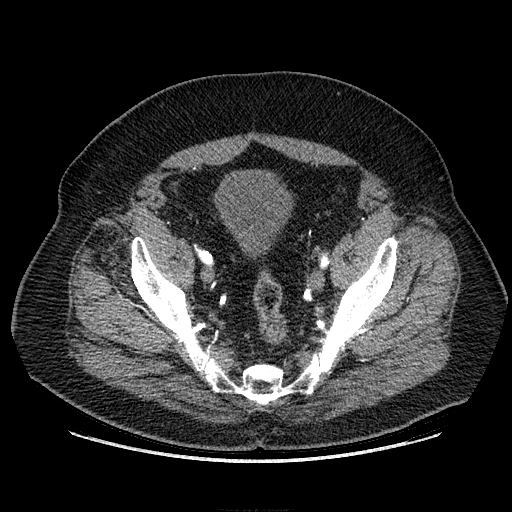
[im 69/192  soft-tissue]
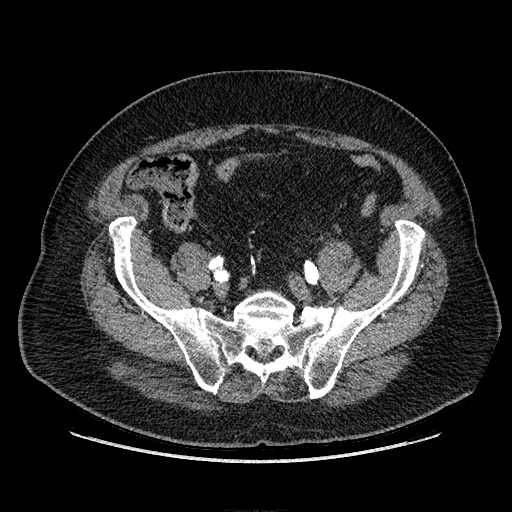
[im 82/192  soft-tissue]
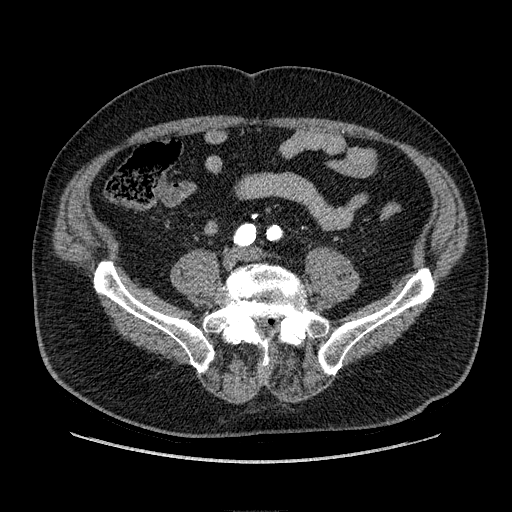
[im 110/192  soft-tissue]
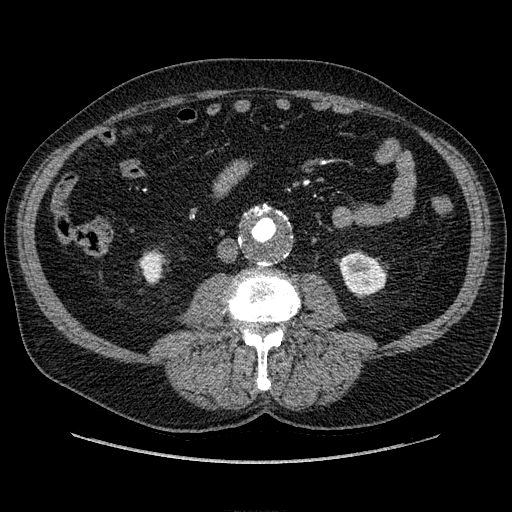
[im 123/192  soft-tissue]
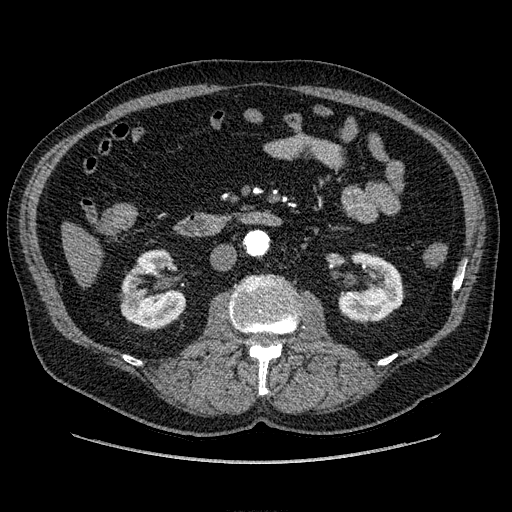
[im 137/192  soft-tissue]
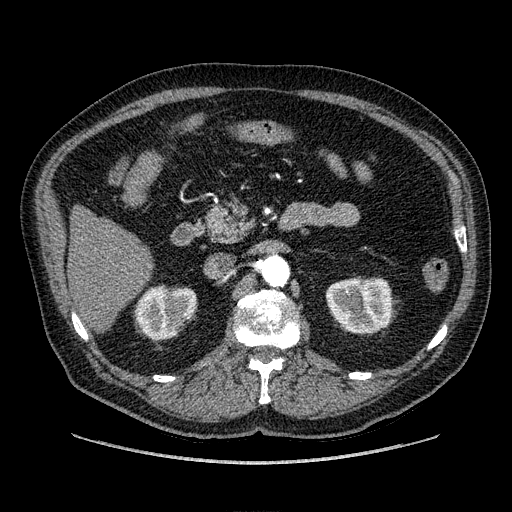
[im 137/192  lung]
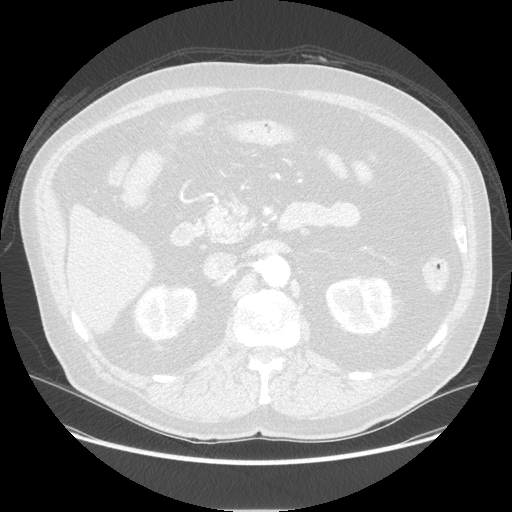
[im 137/192  bone]
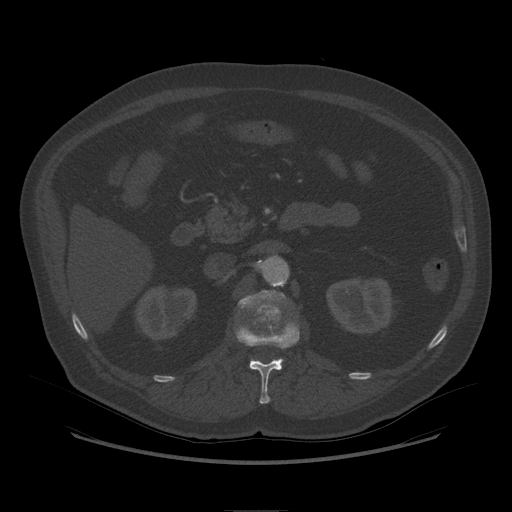
[im 151/192  soft-tissue]
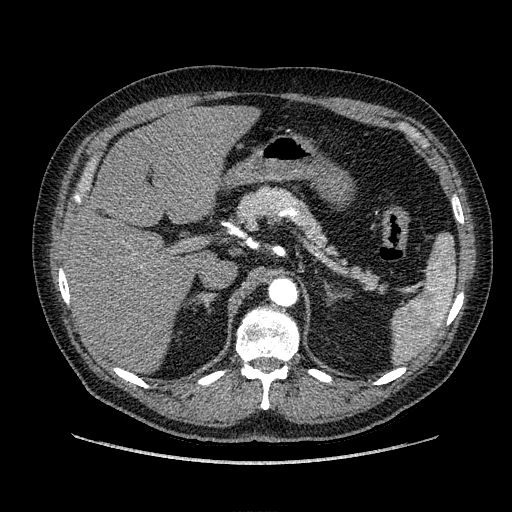
[im 151/192  lung]
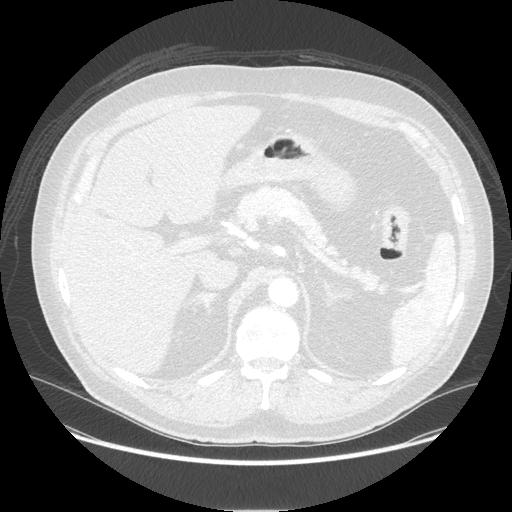
[im 164/192  soft-tissue]
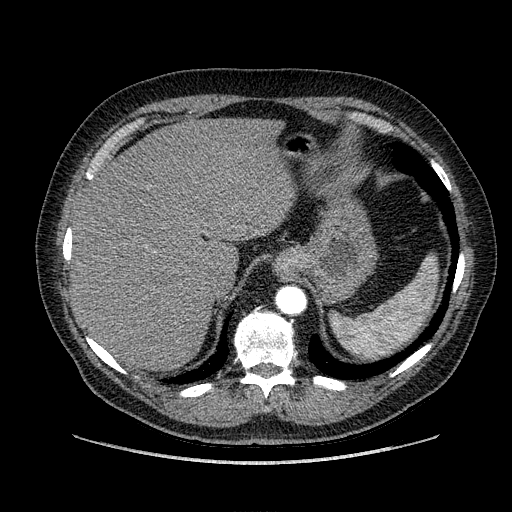
[im 164/192  lung]
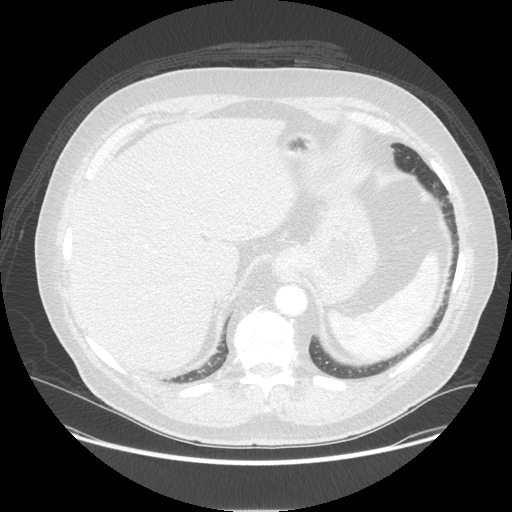
[im 178/192  soft-tissue]
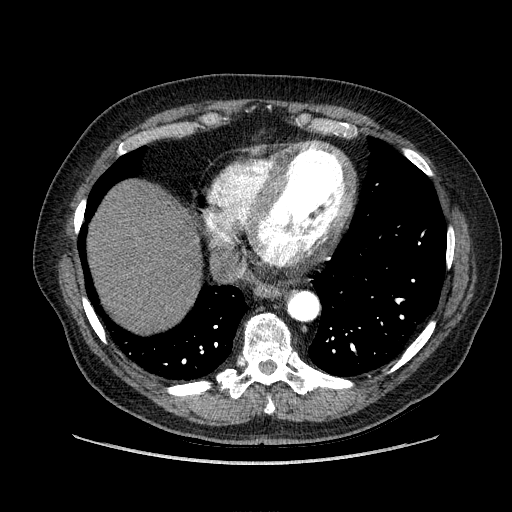
[im 178/192  lung]
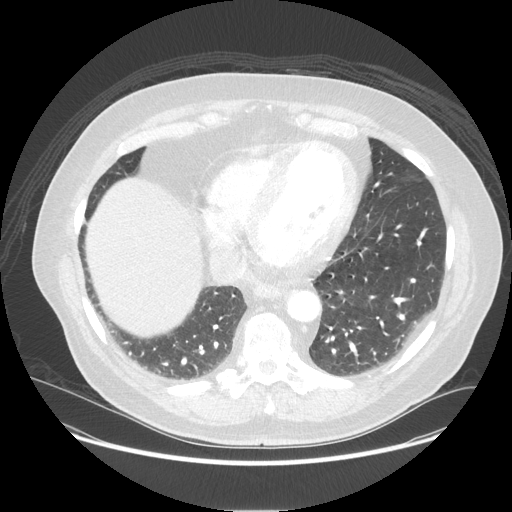

[12 of 46 positions shown; findings below may reference images not displayed]

FINDINGS: An infrarenal abdominal aortic aneurysm is re- demonstrated with
maximal AP and transverse diameters of 5.1 and 5.1 cm. Previously,
measurements were 4.8 and 4.8 cm. The aneurysmal segment contain
circumferential chronic mural thrombus. There is slight angulation
at the neck without significant plaque.

Celiac origin is patent. Branch vessels are patent with expected
anatomy

SMA origin is patent. There is smooth long segment plaque involving
the SMA beginning 1.5 cm from its takeoff, and extending 2 cm.
Branch vessels are grossly patent.

Single right renal artery and 2 left renal arteries are patent with
atherosclerotic calcification at their origins.

IMA origin is occluded.  It reconstitutes just beyond its origin.

Right common iliac artery is patent with a maximal diameter of 15
mm. There is moderate diffuse disease of the right external iliac
artery. The common femoral artery is occluded. The profunda femoral
artery reconstitutes. The upper superficial femoral artery is
occluded. The right internal iliac artery is moderately diseased. It
is ectatic with a maximal diameter of 9 mm.

Left common iliac artery is non aneurysmal and patent there is
moderate diffuse disease of the left external iliac artery. Left
internal iliac artery is patent. Left common femoral artery is also
occluded. The upper superficial femoral artery is also occluded. The
profunda femoral artery reconstitutes.

Calcified granuloma at the right lung base.

Calcified granulomata in the liver.

Postcholecystectomy.

Spleen, pancreas, adrenal glands are within normal limits.

There is scarring involving both kidneys. Small hypodensities in
both kidneys are not significantly changed.

No free-fluid.  No abnormal retroperitoneal adenopathy.

Bladder and prostate are within normal limits.

No acute bony deformity. Advanced degenerative changes in the
thoracic and lumbar spine are re- demonstrated.

Review of the MIP images confirms the above findings.
IMPRESSION: Infrarenal abdominal aortic aneurysm has increased from a maximal
diameter of 4.8 cm to a maximal diameter of 5.1 cm. Recommend
followup by abdomen and pelvis CTA in 3-6 months, and vascular
surgery referral/consultation if not already obtained. This
recommendation follows ACR consensus guidelines: White Paper of the
ACR Incidental Findings Committee II on Vascular Findings. [HOSPITAL] 8533; [DATE].

Bilateral common femoral artery occlusion.

Interval cholecystectomy.
